# Patient Record
Sex: Female | Born: 1965 | Race: White | Hispanic: No | Marital: Married | Smoking: Never smoker
Health system: Southern US, Community
[De-identification: ages and names within clinical notes are randomized; demographics above are authoritative.]

## PROBLEM LIST (undated history)

## (undated) DIAGNOSIS — T7840XA Allergy, unspecified, initial encounter: Secondary | ICD-10-CM

## (undated) HISTORY — DX: Allergy, unspecified, initial encounter: T78.40XA

---

## 2003-07-14 ENCOUNTER — Other Ambulatory Visit: Admission: RE | Admit: 2003-07-14 | Discharge: 2003-07-14 | Payer: Self-pay | Admitting: Obstetrics and Gynecology

## 2004-10-27 ENCOUNTER — Other Ambulatory Visit: Admission: RE | Admit: 2004-10-27 | Discharge: 2004-10-27 | Payer: Self-pay | Admitting: Obstetrics and Gynecology

## 2006-02-01 ENCOUNTER — Other Ambulatory Visit: Admission: RE | Admit: 2006-02-01 | Discharge: 2006-02-01 | Payer: Self-pay | Admitting: Obstetrics and Gynecology

## 2010-07-09 ENCOUNTER — Encounter: Admission: RE | Admit: 2010-07-09 | Discharge: 2010-07-09 | Payer: Self-pay | Admitting: Obstetrics and Gynecology

## 2011-03-17 ENCOUNTER — Ambulatory Visit (INDEPENDENT_AMBULATORY_CARE_PROVIDER_SITE_OTHER): Payer: BLUE CROSS/BLUE SHIELD | Admitting: Sports Medicine

## 2011-03-17 ENCOUNTER — Encounter: Payer: Self-pay | Admitting: Sports Medicine

## 2011-03-17 DIAGNOSIS — M7711 Lateral epicondylitis, right elbow: Secondary | ICD-10-CM | POA: Insufficient documentation

## 2011-03-17 DIAGNOSIS — M7710 Lateral epicondylitis, unspecified elbow: Secondary | ICD-10-CM

## 2011-03-17 DIAGNOSIS — M25529 Pain in unspecified elbow: Secondary | ICD-10-CM

## 2011-03-17 MED ORDER — KETOPROFEN POWD: Status: DC

## 2011-03-17 NOTE — Assessment & Plan Note (Addendum)
Right elbow/forearm pain secondary to old linear split within muscle belly of forearm extensors which appears healed with extensive calcification as seen on MSK u/s.  Also has small area of calcification at lateral epicondyle as well. Patient fitted with body helix elbow sleeve for adequate compression. Start elbow exercises (wrist extensions & rotations) as noted in patient instructions doing exercises 2-3 times a day. Massage elbow 2-3 times a day with tennis ball or similar object. Rx for ketoprofen gel to apply up to 4 times a day. Given recommendations for tennis racquet tension and grip size. She not initiate any tenderness until can do exercises with 5 pounds and no pain. Followup in 6 weeks for reevaluation.

## 2011-03-17 NOTE — Patient Instructions (Signed)
You have an old tear in your forearm extensors of your right elbow with some calcium. Use massage with tennis ball 2-3 times a day. Start using ketoprofen gel apply to elbows 3-4 times a day. Start doing elbow exercises: - Start with 1 lb: wrist extensions, wrist rotations --> progress to 2 lbs when not having pain with same exercises, then 3 lbs when not having any pain with 2-lbs. - Do exercises 2-3 times a day Plan to return to tennis when able to do 5-lbs without pain.\ For tennis should have string tension no greater than 50. Tennis grip size should be 4 1/8 with overgrip. Follow-up 6-weeks & will repeat ultrasound.

## 2011-03-17 NOTE — Progress Notes (Signed)
  Subjective:    Patient ID: Chelsey Gordon, female    DOB: 26-May-1966, 45 y.o.   MRN: [id number]  HPI 45 year old female to office with complaint of bilateral elbow pain, right greater than left. She eats and writes left-handed, but plays tennis and does all other activities with her right hand. Right elbow pain present for over one year, was ignoring for quite some time and continuing to play tennis but was worsening. Has not played tennis since January due to pain. Pain along the lateral elbow and lateral forearm. Has occasional numbness and tingling down into the hand. Denies any weakness. Has had pain picking up her purse in the past. Has been doing active release therapy with [facility] since February which was initially helpful, but not helping as much any longer. Has tried counterforce braces in the past without much improvement. Is getting ibuprofen as needed.  Also experiencing some intermittent left lateral forearm pain it is not as intense as on the right. Has not tried anything for this side other than the active release therapy. Denies any numbness or tingling. Denies any weakness.  No known drug allergies   Review of Systems    per history of present illness, otherwise review of systems is negative Objective:   Physical Exam GENERAL: ALERT AND ORIENTED X3, NO ACUTE DISTRESS, PLEASANT SKIN: No rashes or lesions RESPIRATIONS: 15 and unlabored MSK: - Right elbow: Full range of motion without pain. No swelling or effusion. Mildly tender palpation over the lateral epicondyles and moderate tenderness over the proximal extensor bundle in the proximal forearm. No medial epicondyle tenderness or olecranon tenderness. Mild discomfort with book test in the lateral forearm, no significant pain or discomfort with resisted wrist extension or resisted wrist flexion.  Normal grip strength. Negative Tinel's over the cubital total. -Left elbow: Full range of motion without pain. No swelling or  effusion. Mild tenderness palpation over the lateral epicondyle. No tenderness over the olecranon or medial epicondyles. Minimal tenderness over extensor bundle in the proximal forearm. No pain with book test or wrist and wrist flexion/extension. Normal grip strength. Negative Tinel's over cubital tunnel.  NEURO: Sensation intact to light touch  VASCULAR: Pulses +2/4 and symmetric radial arteries bilaterally, normal capillary refill   MSK ultrasound:  Right elbow- small hypoechoic area at lateral epicondyle with minimal amount of hyperechoic change consistent with calcification and some surrounding inflammation. No significant increased Doppler flow in this area. Patient is noted to have hyperechoic change within the extensor muscle belly of the proximal forearm visualized in both long and short views measuring approximately 2 cm in longitudinal views. Finding consistent with previous muscle tear which has healed with calcification. Mild increased Doppler flow is appreciated in the proximal forearm in this area.  Left elbow- normal appearing lateral epicondyles and normal-appearing tendons. Small tiny hyperechoic area within the muscle belly of extensor muscle without increased Doppler flow or surrounding edema, this is also consistent with small old muscle tear which has healed with some calcification.       Assessment & Plan:

## 2011-05-03 ENCOUNTER — Ambulatory Visit (INDEPENDENT_AMBULATORY_CARE_PROVIDER_SITE_OTHER): Payer: BLUE CROSS/BLUE SHIELD | Admitting: Sports Medicine

## 2011-05-03 ENCOUNTER — Encounter: Payer: Self-pay | Admitting: Sports Medicine

## 2011-05-03 VITALS — BP 114/74 | HR 62

## 2011-05-03 DIAGNOSIS — R200 Anesthesia of skin: Secondary | ICD-10-CM

## 2011-05-03 DIAGNOSIS — R209 Unspecified disturbances of skin sensation: Secondary | ICD-10-CM

## 2011-05-03 DIAGNOSIS — M25529 Pain in unspecified elbow: Secondary | ICD-10-CM

## 2011-05-03 NOTE — Progress Notes (Signed)
  Subjective:    Patient ID: Chelsey Gordon, female    DOB: August 20, 1966, 45 y.o.   MRN: [id number]  HPI Was seen about 6 weeks ago for right lateral epicondylitis and left forearm tension.    Right lateral epicondylitis: Has been wearing body helix with elbow exercixes, able to do 3 pounds of exercises without pain, has not needed ketoprofen gel recently.  Has not been back to playing tennis yet.    Today is most concerned about bilateral forearm parasthesias.  Right greater than left. 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 Notes left shoulder, neck tension.  No weakness.  Comes and goes sporadically lasting  approx 5-10 minutes at a time with no obvious triggers.    Review of SystemsSee HPI     Objective:   Physical Exam NAD   right elbow, no pain over palpation of lateral epicondyl or forearm extensors.  Able to flex and extend wrist without pai n. Good strength  Neck Exam: Inspection: forward flexed neck position approx 8-10 degress Palpable tenderness: increased muscle tension over left trapezius Negative spurling's Full neck range of motion Grip strength and sensation normal in bilateral hands Strength good C4 to T1 distribution No sensory change to C4 to T1       Assessment & Plan:

## 2011-05-03 NOTE — Assessment & Plan Note (Addendum)
No evidence of cervical spine pathology.  Likely nerve irritation from increased muscle tension, poor posture and more computer use.  Advised on scapular stabilization exercises to promote muscle tension, improve posture to avoid forward flexion of spine.  May also take B6 100 mg daily.  Follow-up in 2-3 months if no improvement.

## 2011-05-03 NOTE — Assessment & Plan Note (Signed)
Marked improvement.  Advised to continue exercises, now may start hitting tennis balls lightly, increase activity slowly as tolerated.

## 2011-09-22 ENCOUNTER — Encounter: Payer: Self-pay | Admitting: Family Medicine

## 2011-09-22 ENCOUNTER — Ambulatory Visit (INDEPENDENT_AMBULATORY_CARE_PROVIDER_SITE_OTHER): Payer: BC Managed Care – PPO | Admitting: Family Medicine

## 2011-09-22 VITALS — BP 94/68 | HR 73

## 2011-09-22 DIAGNOSIS — M25529 Pain in unspecified elbow: Secondary | ICD-10-CM

## 2011-09-22 DIAGNOSIS — M7710 Lateral epicondylitis, unspecified elbow: Secondary | ICD-10-CM

## 2011-09-22 DIAGNOSIS — IMO0002 Reserved for concepts with insufficient information to code with codable children: Secondary | ICD-10-CM

## 2011-09-22 DIAGNOSIS — R209 Unspecified disturbances of skin sensation: Secondary | ICD-10-CM

## 2011-09-22 DIAGNOSIS — M25539 Pain in unspecified wrist: Secondary | ICD-10-CM

## 2011-09-22 DIAGNOSIS — S338XXA Sprain of other parts of lumbar spine and pelvis, initial encounter: Secondary | ICD-10-CM

## 2011-09-22 DIAGNOSIS — R200 Anesthesia of skin: Secondary | ICD-10-CM

## 2011-09-22 MED ORDER — MELOXICAM 15 MG PO TABS: 15.0000 mg | ORAL_TABLET | Freq: Every day | ORAL | Status: DC

## 2011-09-22 NOTE — Progress Notes (Signed)
Subjective:    Patient ID: Chelsey Gordon, female    DOB: October 24, 1966, 45 y.o.   MRN: [id number]  HPI  Left groin pain since November 2011, she felt snap while working out, and developed the  Pain, it got better never disappeared and worsen in the last month.Payton Doughty, 2/10 ache. No numbness or tingling, worse with lounges. Worse with weight bearing activities. She has no noticed   Also right elbow pain on the lateral aspect of her elbow for the last 4-5 month seen for that problem in the past, dull ache 2/10, worse after playing tennis, no numbness or tingling. She is much better compare with before. She discussed previously nitro therapy but she was not a candidate for it.  Patient Active Problem List  Diagnoses  . Elbow pain  . Numbness of upper limb  . Sprain of groin   Current Outpatient Prescriptions on File Prior to Visit  Medication Sig Dispense Refill  . calcium carbonate 200 MG capsule Take 250 mg by mouth daily.        . Ketoprofen POWD Ketoprofen 20% gel compounded - apply to elbows 4-times/day  60 g  3  . magnesium 30 MG tablet Take 30 mg by mouth daily.        . Multiple Vitamin (MULTIVITAMIN) tablet Take 1 tablet by mouth daily.        . Omega 3-6-9 Fatty Acids (OMEGA 3-6-9 COMPLEX PO) Take 1 tablet by mouth daily.        . valACYclovir (VALTREX) 500 MG tablet Take 500 mg by mouth daily.        Marland Kitchen VITAMIN D, CHOLECALCIFEROL, PO Take 1 tablet by mouth daily.         No Known Allergies   Review of Systems  Constitutional: Negative for fever, chills, diaphoresis and fatigue.  Musculoskeletal: Negative for back pain, joint swelling, arthralgias and gait problem.  Neurological: Negative for weakness and numbness.       Objective:   Physical Exam  Constitutional: She is oriented to person, place, and time. She appears well-developed and well-nourished.       BP 94/68  Pulse 73   Pulmonary/Chest: Effort normal.  Musculoskeletal:       Left hip with FROM . TTP in the  inferior left pubis rami. TTP in the adductor magnus tendon.  Mild pain in groin area with fexion-internal rotation   No TTP on the trochanteric bursa. Pain with resisted adduction in the groin area. No limping. Sensation intact distally  No pain with resisted abduction  Right elbow with intact skin. No swelling, no hematoma. Full Range of motion. Tender to palpation over the lateral epicondyle. Wrist extension against resistance reproduce mild  lateral elbow pain. No instability.Sensation intact distally   Neurological: She is alert and oriented to person, place, and time.  Skin: Skin is warm. No rash noted. No erythema. No pallor.  Psychiatric: She has a normal mood and affect.          Assessment & Plan:   1. Sprain of groin  DG Pelvis 1-2 Views, meloxicam (MOBIC) 15 MG tablet  2. Lateral epicondylitis  meloxicam (MOBIC) 15 MG tablet  Decrease abdominal work outs Rest for the following 2 weeks, specially high impact activities Moist heat in the groin area daily Hip stretches. F/U in 2 weeks if no improvement with treatment MSK U/S possible injection  Wrist splint with  Risky activities Ice massage in the elbow for 20 min Lateral epicondylitis  stretches twice a day. F/u in 2-3 weeks.

## 2011-09-23 ENCOUNTER — Ambulatory Visit
Admission: RE | Admit: 2011-09-23 | Discharge: 2011-09-23 | Disposition: A | Discharge status: Home / Self Care | Payer: BC Managed Care – PPO | Source: Ambulatory Visit | Attending: Family Medicine | Admitting: Family Medicine

## 2011-10-13 ENCOUNTER — Ambulatory Visit (INDEPENDENT_AMBULATORY_CARE_PROVIDER_SITE_OTHER): Payer: BC Managed Care – PPO | Admitting: Sports Medicine

## 2011-10-13 VITALS — BP 110/60

## 2011-10-13 DIAGNOSIS — M25529 Pain in unspecified elbow: Secondary | ICD-10-CM

## 2011-10-13 DIAGNOSIS — IMO0002 Reserved for concepts with insufficient information to code with codable children: Secondary | ICD-10-CM

## 2011-10-13 DIAGNOSIS — S338XXA Sprain of other parts of lumbar spine and pelvis, initial encounter: Secondary | ICD-10-CM

## 2011-10-13 DIAGNOSIS — R209 Unspecified disturbances of skin sensation: Secondary | ICD-10-CM

## 2011-10-13 MED ORDER — NITROGLYCERIN 0.2 MG/HR TD PT24: MEDICATED_PATCH | TRANSDERMAL | Status: DC

## 2011-10-14 NOTE — Progress Notes (Signed)
  Subjective:    Patient ID: Chelsey Gordon, female    DOB: 11-19-1966, 45 y.o.   MRN: [id number]  HPI  Ms. Chelsey Gordon is  A pleasant 45 yo female patient coming to F/U on her left groin pain. She has rested from her intensive work out and states that during the first week she noticed a slight improvement but then the pain returned. She was dealing with an URI and noticed than when she cough that will trigger the pain. Her pain is located in the left groin area and in the insertion of the adductor tendons in the pubis. The Dull, 2/10 ache. No numbness or tingling, worse with lounges. Worse with weight bearing activities. No numbness or tingling.  Her elbow pain has resolved.  Patient Active Problem List  Diagnoses  . Elbow pain  . Numbness of upper limb  . Sprain of groin   Current Outpatient Prescriptions on File Prior to Visit  Medication Sig Dispense Refill  . calcium carbonate 200 MG capsule Take 250 mg by mouth daily.        . Ketoprofen POWD Ketoprofen 20% gel compounded - apply to elbows 4-times/day  60 g  3  . magnesium 30 MG tablet Take 30 mg by mouth daily.        . meloxicam (MOBIC) 15 MG tablet Take 1 tablet (15 mg total) by mouth daily.  30 tablet  1  . Multiple Vitamin (MULTIVITAMIN) tablet Take 1 tablet by mouth daily.        . Omega 3-6-9 Fatty Acids (OMEGA 3-6-9 COMPLEX PO) Take 1 tablet by mouth daily.        . valACYclovir (VALTREX) 500 MG tablet Take 500 mg by mouth daily.        Marland Kitchen VITAMIN D, CHOLECALCIFEROL, PO Take 1 tablet by mouth daily.         No Known Allergies    Review of Systems  Constitutional: Negative for fever, chills, diaphoresis and fatigue.  Musculoskeletal: Negative for myalgias, back pain, joint swelling and gait problem.  Neurological: Negative for weakness and numbness.       Objective:   Physical Exam  Constitutional: She appears well-developed and well-nourished.       BP 110/60   Abdominal: Soft. Bowel sounds are normal.       Mild  bulging with valsalva maneuver in left groin area.  Musculoskeletal:       Left hip with FROM . TTP in the inferior left pubis rami. TTP in the adductor magnus tendon. Mild pain in groin area with fexion-internal rotation  No TTP on the trochanteric bursa. Pain with resisted adduction in the groin area. No limping. Sensation intact distally  No pain with resisted abduction    Neurological: She is alert.  Skin: Skin is warm. No rash noted. No erythema. No pallor.  Psychiatric: She has a normal mood and affect.    MSK [place] : Hypoechoic area at the insertion of the adductor tendon in the pubis rami.  There may be a weakness of the posterior inguinal wall.       Assessment & Plan:   1. Sprain of groin  nitroGLYCERIN (NITRODUR - DOSED IN MG/24 HR) 0.2 mg/hr  2. Elbow pain    Nitro patch. Adductor HEP Core strengthening exercise Avoid extreme adductors stretches F/U in 4 weeks

## 2011-10-14 NOTE — Patient Instructions (Signed)
Nitro patch. Adductor HEP Core strengthening exercise Avoid extreme adductors stretches F/U in 4 weeks

## 2011-11-03 ENCOUNTER — Encounter: Payer: Self-pay | Admitting: Family Medicine

## 2011-11-03 ENCOUNTER — Ambulatory Visit (INDEPENDENT_AMBULATORY_CARE_PROVIDER_SITE_OTHER): Payer: BC Managed Care – PPO | Admitting: Family Medicine

## 2011-11-03 VITALS — BP 106/70 | HR 75

## 2011-11-03 DIAGNOSIS — K4120 Bilateral femoral hernia, without obstruction or gangrene, not specified as recurrent: Secondary | ICD-10-CM

## 2011-11-03 NOTE — Progress Notes (Signed)
  Subjective:    Patient ID: Chelsey Gordon, female    DOB: 1966/02/04, 45 y.o.   MRN: [id number]  HPI Chelsey Gordon is A pleasant 45 yo female patient coming to F/U on her left groin pain. She started to do her abdominal workout again and she has not decided name in the left groin/inguinal area, she has not decided a bulging mass in the left groin inguinal hernia.  Her pain is located in the left groin area .Dull, 2/10 ache. No numbness or tingling, worse with lounges. Worse with weight bearing activities. No radiation. Her pubic pain has resolved. She is also complaining of a similar pain on the right groin area but much milder .  Patient Active Problem List  Diagnoses  . Elbow pain  . Numbness of upper limb  . Sprain of groin   Current Outpatient Prescriptions on File Prior to Visit  Medication Sig Dispense Refill  . calcium carbonate 200 MG capsule Take 250 mg by mouth daily.        . magnesium 30 MG tablet Take 30 mg by mouth daily.        . meloxicam (MOBIC) 15 MG tablet Take 1 tablet (15 mg total) by mouth daily.  30 tablet  1  . Multiple Vitamin (MULTIVITAMIN) tablet Take 1 tablet by mouth daily.        . Omega 3-6-9 Fatty Acids (OMEGA 3-6-9 COMPLEX PO) Take 1 tablet by mouth daily.        . valACYclovir (VALTREX) 500 MG tablet Take 500 mg by mouth daily.        Marland Kitchen VITAMIN D, CHOLECALCIFEROL, PO Take 1 tablet by mouth daily.         No Known Allergies      Review of Systems  Constitutional: Negative for fever, chills, diaphoresis and fatigue.  Musculoskeletal: Positive for joint swelling. Negative for back pain, arthralgias and gait problem.  Neurological: Negative for weakness and numbness.       Objective:   Physical Exam  Constitutional: She appears well-developed and well-nourished.       BP 106/70  Pulse 75   Pulmonary/Chest: Effort normal.  Abdominal: Soft.       Left groin area with tenderness to the patient along with the superficial inguinal ring. There is a  bulging mass with Valsalva maneuver in the inguinal area. Right groin area with mild discomfort to palpation along the superficial inguinal ring, no mass felt that with Valsalva maneuver.  Neurological: She is alert.  Skin: Skin is warm. No rash noted. No erythema. No pallor.  Psychiatric: She has a normal mood and affect. Her behavior is normal.    MSK U/S: Of the groin area is significant for a bilateral femoral hernia worse on the left side. There is an obvious mass effect and with Valsalva maneuver pushing out medial to the femoral vein.      Assessment & Plan:     1. Femoral hernia, bilateral     Chelsey Gordon Will choose a general surgeon and with the we'll schedule an appointment with his office to be evaluated regarding her bilateral femoral hernia. She will let us know the name of the surgeon so we can contact his office and spoke with him regarding our ultrasound findings

## 2011-11-23 ENCOUNTER — Encounter (INDEPENDENT_AMBULATORY_CARE_PROVIDER_SITE_OTHER): Payer: Self-pay | Admitting: General Surgery

## 2011-11-23 ENCOUNTER — Ambulatory Visit (INDEPENDENT_AMBULATORY_CARE_PROVIDER_SITE_OTHER): Payer: BC Managed Care – PPO | Admitting: General Surgery

## 2011-11-23 VITALS — BP 120/68 | HR 60 | Temp 97.6°F | Resp 12 | Ht 65.0 in | Wt 126.8 lb

## 2011-11-23 DIAGNOSIS — R1032 Left lower quadrant pain: Secondary | ICD-10-CM | POA: Insufficient documentation

## 2011-11-23 NOTE — Progress Notes (Signed)
Subjective:     Patient ID: Chelsey Gordon, female   DOB: [birth year], 45 y.o.   MRN: [id number]  HPI I was asked to the patient by Dr. fields in regards to possible left femoral hernia. The patient initially developed some left groin pain in November of 2011. This was more medial in her groin musculature and seemed to resolve with rest. A couple months ago she developed a cold and with coughing had pain in a different portion of her left groin. She is not felt a discrete mass popping in and out but continues to have intermittent pain especially with certain activities. She has curtailed her exercise routine. She was seen by Dr. Darrick Penna and ultrasound was done showing some concern for a femoral hernia on both sides. She has not had change in bowel or bladder habits.  Review of Systems  Constitutional: Negative for fever, chills and unexpected weight change.  HENT: Negative for hearing loss, congestion, sore throat, trouble swallowing and voice change.   Eyes: Negative for visual disturbance.  Respiratory: Negative for cough and wheezing.   Cardiovascular: Negative for chest pain, palpitations and leg swelling.  Gastrointestinal: Negative for nausea, vomiting, abdominal pain, diarrhea, constipation, blood in stool, abdominal distention and anal bleeding.  Genitourinary: Negative for hematuria, vaginal bleeding and difficulty urinating.  Musculoskeletal: Negative for arthralgias.       See history of present illness  Skin: Negative for rash and wound.  Neurological: Negative for seizures, syncope and headaches.  Hematological: Negative for adenopathy. Does not bruise/bleed easily.  Psychiatric/Behavioral: Negative for confusion.       Objective:   Physical Exam  Constitutional: She is oriented to person, place, and time. She appears well-developed and well-nourished. No distress.  HENT:  Head: Normocephalic and atraumatic.  Eyes: Conjunctivae are normal. Pupils are equal, round, and reactive to  light.  Neck: Normal range of motion. Neck supple. No tracheal deviation present.  Cardiovascular: Normal rate, regular rhythm and normal heart sounds.   Pulmonary/Chest: Effort normal and breath sounds normal. No respiratory distress. She has no wheezes. She has no rales. She exhibits no tenderness.  Abdominal: Soft. She exhibits no distension and no mass. There is no tenderness. There is no rebound and no guarding.       Right inguinal exam reveals no evidence of inguinal or femoral hernia. Left inguinal exam reveals no evidence of inguinal or femoral hernia.  Musculoskeletal: Normal range of motion.  Neurological: She is alert and oriented to person, place, and time.       Assessment:     Left inguinal pain without demonstrable hernia on either side    Plan:     The patient's symptoms warrant further investigation.I will check a CT scan of the pelvis with contrast. This will evaluate the inguinal and femoral regions on both sides. We will see her back thereafter.

## 2011-11-24 ENCOUNTER — Ambulatory Visit: Payer: BC Managed Care – PPO | Admitting: Family Medicine

## 2011-11-25 ENCOUNTER — Ambulatory Visit
Admission: RE | Admit: 2011-11-25 | Discharge: 2011-11-25 | Disposition: A | Discharge status: Home / Self Care | Payer: BC Managed Care – PPO | Source: Ambulatory Visit | Attending: General Surgery | Admitting: General Surgery

## 2011-11-25 DIAGNOSIS — R1032 Left lower quadrant pain: Secondary | ICD-10-CM

## 2011-11-25 MED ORDER — IOHEXOL 300 MG/ML  SOLN
100.0000 mL | Freq: Once | INTRAMUSCULAR | Status: AC | PRN
  Administered 2011-11-25: 100 mL via INTRAVENOUS

## 2011-12-07 ENCOUNTER — Ambulatory Visit (INDEPENDENT_AMBULATORY_CARE_PROVIDER_SITE_OTHER): Payer: BC Managed Care – PPO | Admitting: General Surgery

## 2011-12-07 ENCOUNTER — Encounter (INDEPENDENT_AMBULATORY_CARE_PROVIDER_SITE_OTHER): Payer: Self-pay | Admitting: General Surgery

## 2011-12-07 VITALS — BP 102/72 | HR 76 | Temp 98.7°F | Resp 18 | Ht 66.0 in | Wt 125.2 lb

## 2011-12-07 DIAGNOSIS — R1032 Left lower quadrant pain: Secondary | ICD-10-CM

## 2011-12-07 NOTE — Patient Instructions (Signed)
Rest your core muscles and take scheduled ibuprofen daily for two weeks

## 2011-12-07 NOTE — Progress Notes (Signed)
Subjective:     Patient ID: Chelsey Gordon, female   DOB: [birth year], 46 y.o.   MRN: [id number]  HPI Patient presents for bilateral inguinal pain following. No hernias were found on her examination so we sent her for CT scan of the abdomen and pelvis. This demonstrated no hernias. The patient wanted to go over her films and we did so in detail. No other significant abnormalities were seen. The pain she is having in both groins and lower and left lower quadrant is improved but not resolved.  Review of Systems     Objective:   Physical Exam  Cardiovascular: Normal rate, normal heart sounds and intact distal pulses.   Pulmonary/Chest: Effort normal and breath sounds normal.  Abdomen is soft and nontender. There is no abdominal wall hernias felt. There are no bilateral inguinal hernias felt. No masses are felt     Assessment:     Bilateral inguinal pain and abdominal wall pain likely due to muscular strain. There is no evidence of hernia.    Plan:     Continue core muscles rest and try 2 week course of ibuprofen scheduled. I feel this should help alleviate this pain as it is most likely musculoskeletal. Return p.r.n.

## 2011-12-14 ENCOUNTER — Encounter (INDEPENDENT_AMBULATORY_CARE_PROVIDER_SITE_OTHER): Payer: BC Managed Care – PPO | Admitting: General Surgery

## 2012-08-15 ENCOUNTER — Ambulatory Visit: Payer: BC Managed Care – PPO | Admitting: Family

## 2012-08-17 ENCOUNTER — Ambulatory Visit (INDEPENDENT_AMBULATORY_CARE_PROVIDER_SITE_OTHER): Payer: BC Managed Care – PPO | Admitting: Family

## 2012-08-17 ENCOUNTER — Encounter: Payer: Self-pay | Admitting: Family

## 2012-08-17 ENCOUNTER — Ambulatory Visit (INDEPENDENT_AMBULATORY_CARE_PROVIDER_SITE_OTHER)
Admission: RE | Admit: 2012-08-17 | Discharge: 2012-08-17 | Disposition: A | Discharge status: Home / Self Care | Payer: BC Managed Care – PPO | Source: Ambulatory Visit | Attending: Family | Admitting: Family

## 2012-08-17 VITALS — BP 98/70 | Temp 98.4°F | Ht 66.0 in | Wt 123.0 lb

## 2012-08-17 DIAGNOSIS — M25539 Pain in unspecified wrist: Secondary | ICD-10-CM

## 2012-08-17 DIAGNOSIS — M25531 Pain in right wrist: Secondary | ICD-10-CM

## 2012-08-17 DIAGNOSIS — Z0000 Encounter for general adult medical examination without abnormal findings: Secondary | ICD-10-CM

## 2012-08-17 LAB — BASIC METABOLIC PANEL
CO2: 27 mEq/L (ref 19–32)
Calcium: 9.3 mg/dL (ref 8.4–10.5)
Chloride: 105 mEq/L (ref 96–112)
Creatinine, Ser: 0.9 mg/dL (ref 0.4–1.2)
GFR: 77.15 mL/min (ref >60.00)
Glucose, Bld: 99 mg/dL (ref 70–99)
Potassium: 4.9 mEq/L (ref 3.5–5.1)
Sodium: 137 mEq/L (ref 135–145)

## 2012-08-17 LAB — CBC WITH DIFFERENTIAL/PLATELET
Eosinophils Absolute: 0 10*3/uL (ref 0.0–0.7)
Hemoglobin: 13.7 g/dL (ref 12.0–15.0)
Lymphocytes Relative: 37.1 % (ref 12.0–46.0)
Monocytes Absolute: 0.5 10*3/uL (ref 0.1–1.0)
Neutro Abs: 2.3 10*3/uL (ref 1.4–7.7)
Neutrophils Relative %: 51 % (ref 43.0–77.0)
WBC: 4.5 10*3/uL (ref 4.5–10.5)

## 2012-08-17 LAB — LIPID PANEL
LDL Cholesterol: 62 mg/dL (ref 0–99)
Triglycerides: 29 mg/dL (ref 0.0–149.0)
VLDL: 5.8 mg/dL (ref 0.0–40.0)

## 2012-08-17 NOTE — Progress Notes (Signed)
Subjective:    Patient ID: Chelsey Gordon, female    DOB: 07-18-1966, 46 y.o.   MRN: [id number]  HPI 46 year old, non smoking white female seen today to establish care. No past surgeries or significant past medical history. C/o intermittent pain in right wrist on extension since June. Has tried ibuprofen with minimal relief. Reports last pelvic exam and mammogram in June 2013.  This is a routine physical examination for this healthy  Female. Reviewed all health maintenance protocols including mammography and reviewed appropriate screening labs. Her immunization history was reviewed as well as her current medications and allergies refills of her chronic medications were given and the plan for yearly health maintenance was discussed all orders and referrals were made as appropriate.   Review of Systems  Constitutional: Negative.  Negative for fever and fatigue.  HENT: Negative.   Eyes: Negative.   Respiratory: Negative.   Cardiovascular: Negative.   Gastrointestinal: Negative.   Genitourinary: Negative.   Musculoskeletal: Positive for arthralgias.       Pain in her right wrist  Skin: Negative.   Neurological: Negative.   Hematological: Negative.   Psychiatric/Behavioral: Negative.    No past medical history on file.  History   Social History  . Marital Status: Married    Spouse Name: N/A    Number of Children: N/A  . Years of Education: N/A   Occupational History  . Not on file.   Social History Main Topics  . Smoking status: Never Smoker   . Smokeless tobacco: Never Used  . Alcohol Use: Yes  . Drug Use: No  . Sexually Active: Not on file   Other Topics Concern  . Not on file   Social History Narrative  . No narrative on file    No past surgical history on file.  Family History  Problem Relation Age of Onset  . Cancer Father     luekemia    No Known Allergies  Current Outpatient Prescriptions on File Prior to Visit  Medication Sig Dispense Refill  .  ALPRAZolam (XANAX) 0.5 MG tablet Take 0.5 mg by mouth as needed.      . Biotin 5 MG CAPS Take by mouth daily.        . calcium carbonate 200 MG capsule Take 250 mg by mouth daily.        Marland Kitchen GLUCOSAMINE-CHONDROITIN PO Take 2 tablets by mouth daily.        . magnesium gluconate (MAGONATE) 500 MG tablet Take 500 mg by mouth daily.        . Multiple Vitamin (MULTIVITAMIN) tablet Take 1 tablet by mouth daily.        . Omega 3-6-9 Fatty Acids (OMEGA 3-6-9 COMPLEX PO) Take 1 tablet by mouth daily.        Marland Kitchen pyridOXINE (VITAMIN B-6) 100 MG tablet Take 100 mg by mouth daily.        . valACYclovir (VALTREX) 500 MG tablet Take 500 mg by mouth daily.        Marland Kitchen VITAMIN D, CHOLECALCIFEROL, PO Take 1 tablet by mouth daily.          BP 98/70  Temp 98.4 F (36.9 C) (Oral)  Ht 5\' 6"  (1.676 m)  Wt 123 lb (55.792 kg)  BMI 19.85 kg/m2chart    Objective:   Physical Exam  Constitutional: She is oriented to person, place, and time. She appears well-developed and well-nourished.  HENT:  Head: Normocephalic and atraumatic.  Right Ear: External ear  normal.  Left Ear: External ear normal.  Nose: Nose normal.  Mouth/Throat: Oropharynx is clear and moist.  Eyes: Conjunctivae normal and EOM are normal. Pupils are equal, round, and reactive to light.  Neck: Normal range of motion. Neck supple.  Cardiovascular: Normal rate and regular rhythm.  Exam reveals friction rub.   No murmur heard. Pulmonary/Chest: Effort normal and breath sounds normal.  Abdominal: Soft. Bowel sounds are normal.  Musculoskeletal: Normal range of motion. She exhibits no edema and no tenderness.       Right wrist: full ROM with no pain elicited. Grip strengths normal.  Neurological: She is alert and oriented to person, place, and time. She has normal reflexes. She displays normal reflexes. No cranial nerve deficit. She exhibits normal muscle tone. Coordination normal.  Skin: Skin is warm and dry.  Psychiatric: She has a normal mood and  affect. Her behavior is normal. Judgment and thought content normal.          Assessment & Plan:  Assessment: CPX, Right wrist pain  Plan; CBC, BMP, Lipid panel, TSH, referred to radiology for Xray of wrist wrist. Follow up as needed or if symptoms persist of become worst. Health diet and exercise 30 min a day. Self breast exams monthly.

## 2012-08-17 NOTE — Patient Instructions (Addendum)

## 2015-03-02 ENCOUNTER — Encounter: Payer: Self-pay | Admitting: Family Medicine

## 2015-03-02 ENCOUNTER — Ambulatory Visit (INDEPENDENT_AMBULATORY_CARE_PROVIDER_SITE_OTHER): Payer: Managed Care, Other (non HMO) | Admitting: Family Medicine

## 2015-03-02 ENCOUNTER — Encounter (INDEPENDENT_AMBULATORY_CARE_PROVIDER_SITE_OTHER): Payer: Self-pay

## 2015-03-02 VITALS — BP 116/71 | HR 61 | Ht 66.0 in | Wt 123.0 lb

## 2015-03-02 DIAGNOSIS — S86112A Strain of other muscle(s) and tendon(s) of posterior muscle group at lower leg level, left leg, initial encounter: Secondary | ICD-10-CM

## 2015-03-02 DIAGNOSIS — S86812A Strain of other muscle(s) and tendon(s) at lower leg level, left leg, initial encounter: Secondary | ICD-10-CM | POA: Diagnosis not present

## 2015-03-02 NOTE — Patient Instructions (Signed)
You have a calf strain/partial tear (tennis leg) Compression sleeve to help with swelling and pain when you're up and walking around. Icing for 15 minutes at a time 3-4 times a day for the first 3 days then ice or heat depending on whichever feels better to you. Heel lifts either in temporary orthotics or on their own to prevent further strain. Cam walker for 7-10 days (with or without the heel lifts). Crutches as needed Tylenol and/or aleve for pain. Start ankle range of motion exercises (up/down and alphabet exercises). 3 sets of 10 twice a day. Cycling with low resistance or swimming for cross training in meantime. Follow up in 2 weeks at the latest.

## 2015-03-04 DIAGNOSIS — S86119A Strain of other muscle(s) and tendon(s) of posterior muscle group at lower leg level, unspecified leg, initial encounter: Secondary | ICD-10-CM | POA: Insufficient documentation

## 2015-03-04 NOTE — Assessment & Plan Note (Signed)
Compression sleeve, icing then consider heat after 3 days.  Heel lifts in cam walker for 7-10 days.  Tylenol, nsaids if needed.  Start ROM exercises twice a day.  Cross training if tolerated.  F/u in 2 weeks at the latest.

## 2015-03-04 NOTE — Progress Notes (Signed)
PCP: CAMPBELL, PADONDA BOYD, FNP  Subjective:   HPI: Patient is a 49 y.o. female here for left calf injury.  Patient reports last night when playing tennis she felt a pop in medial left calf. No prior injuries. No swelling or bruising. Pain level down to 1-2/10. Not taking anything currently for this.  No past medical history on file.  Current Outpatient Prescriptions on File Prior to Visit  Medication Sig Dispense Refill  . ALPRAZolam (XANAX) 0.5 MG tablet Take 0.5 mg by mouth as needed.    . Biotin 5 MG CAPS Take by mouth daily.      . calcium carbonate 200 MG capsule Take 250 mg by mouth daily.      Marland Kitchen GLUCOSAMINE-CHONDROITIN PO Take 2 tablets by mouth daily.      . magnesium gluconate (MAGONATE) 500 MG tablet Take 500 mg by mouth daily.      . Multiple Vitamin (MULTIVITAMIN) tablet Take 1 tablet by mouth daily.      . Omega 3-6-9 Fatty Acids (OMEGA 3-6-9 COMPLEX PO) Take 1 tablet by mouth daily.      Marland Kitchen pyridOXINE (VITAMIN B-6) 100 MG tablet Take 100 mg by mouth daily.      . valACYclovir (VALTREX) 500 MG tablet Take 500 mg by mouth daily.      Marland Kitchen VITAMIN D, CHOLECALCIFEROL, PO Take 1 tablet by mouth daily.       No current facility-administered medications on file prior to visit.    No past surgical history on file.  No Known Allergies  History   Social History  . Marital Status: Married    Spouse Name: N/A  . Number of Children: N/A  . Years of Education: N/A   Occupational History  . Not on file.   Social History Main Topics  . Smoking status: Never Smoker   . Smokeless tobacco: Never Used  . Alcohol Use: Yes  . Drug Use: No  . Sexual Activity: Not on file   Other Topics Concern  . Not on file   Social History Narrative    Family History  Problem Relation Age of Onset  . Cancer Father     luekemia    BP 116/71 mmHg  Pulse 61  Ht 5\' 6"  (1.676 m)  Wt 123 lb (55.792 kg)  BMI 19.86 kg/m2  Review of Systems: See HPI above.    Objective:   Physical Exam:  Gen: NAD  Left lower leg: No gross deformity, swelling, bruising, defect. TTP medial gastroc at musculotendinous junction.  No achilles, other tenderness. FROM ankle with pain on full plantarflexion and dorsiflexion. Unable to do calf raise. NVI distally.    Assessment & Plan:  1. Left calf strain - Compression sleeve, icing then consider heat after 3 days.  Heel lifts in cam walker for 7-10 days.  Tylenol, nsaids if needed.  Start ROM exercises twice a day.  Cross training if tolerated.  F/u in 2 weeks at the latest.

## 2015-03-16 ENCOUNTER — Encounter: Payer: Self-pay | Admitting: Sports Medicine

## 2015-03-16 ENCOUNTER — Ambulatory Visit (INDEPENDENT_AMBULATORY_CARE_PROVIDER_SITE_OTHER): Payer: Managed Care, Other (non HMO) | Admitting: Sports Medicine

## 2015-03-16 VITALS — BP 109/66 | HR 57 | Ht 66.0 in | Wt 123.0 lb

## 2015-03-16 DIAGNOSIS — S86812A Strain of other muscle(s) and tendon(s) at lower leg level, left leg, initial encounter: Secondary | ICD-10-CM

## 2015-03-16 DIAGNOSIS — S86112A Strain of other muscle(s) and tendon(s) of posterior muscle group at lower leg level, left leg, initial encounter: Secondary | ICD-10-CM

## 2015-03-16 NOTE — Progress Notes (Signed)
   Subjective:    Patient ID: Chelsey Gordon, female    DOB: 1966/11/02, 49 y.o.   MRN: [id number]  HPI   Patient comes in today for follow-up on a left calf strain. She was treated by Dr. Barbaraann Barthel on March 28. Initial treatment was with a Cam Walker with heel raises and crutches as needed for pain. She quickly transitioned out of the boot and off of her crutches. She is now walking pain-free. She still has some discomfort with certain exercises in the gym but overall her symptoms are much improved over the past couple of weeks. She did get some mild swelling initially but denies ecchymosis. She has had problems with her calf and the past. She is wearing a compression sleeve with activity as well.     Review of Systems     Objective:   Physical Exam Well-developed, well-nourished. No acute distress. Awake alert and oriented 3.  Left calf: There is still some slight tenderness to palpation at the medial head of the gastrocnemius. No palpable defect. No soft tissue swelling. No ecchymosis. No pain with Achilles or calf stretching. Patient does have some pain with performing a single calf raise on the left. Achilles tendon is intact. Neurovascular intact distally. Walking without a limp.       Assessment & Plan:  Improving left calf pain secondary to calf strain  I've given the patient a copy of [place] heel drop exercises but I have instructed her not to start these until she is able to perform a single calf raise on a step without pain. She will continue with her compression and I have also encouraged her to continue with her heel lifts until she is completely asymptomatic. She may continue to increase her activity in the gym using pain as her guide but I recommended against any running or sprinting for at least the next 3 weeks. She will follow-up for ongoing or recalcitrant issues.

## 2015-12-28 ENCOUNTER — Encounter: Payer: Self-pay | Admitting: Sports Medicine

## 2015-12-28 ENCOUNTER — Ambulatory Visit (INDEPENDENT_AMBULATORY_CARE_PROVIDER_SITE_OTHER): Payer: 59 | Admitting: Sports Medicine

## 2015-12-28 VITALS — BP 108/76 | Ht 66.0 in | Wt 123.0 lb

## 2015-12-28 DIAGNOSIS — M25511 Pain in right shoulder: Secondary | ICD-10-CM | POA: Diagnosis not present

## 2015-12-28 DIAGNOSIS — M7711 Lateral epicondylitis, right elbow: Secondary | ICD-10-CM | POA: Diagnosis not present

## 2015-12-28 NOTE — Progress Notes (Signed)
Subjective:    Patient ID: Chelsey Gordon, female    DOB: 09-Jul-1966, 50 y.o.   MRN: [id number]  Chelsey Gordon is a 50 y.o. female presenting on 12/28/2015 for Right Tennis Elbow and Right Should Pain  HPI   RIGHT SHOULDER PAIN: - Reports symptoms started around Thanksgiving (Nov 2016), first noticed some Right Lateral to Posterior Shoulder pain and discomfort while doing some fly exercises with dumbbells, felt like it was "tweaked" had played tennis the day before, denies an actual "acute injury" at this time but did feel more pain than usual. She continued her exercise and realized next day that there was more pain in her Right shoulder than usual, and decided to rest. Stated it felt like a "pulling in shoulder". Treated this with rest, ice, eventually heat, ibuprofen, took 4-5 weeks off all tennis / weights. Then felt about 95% back to normal, was lifting some logs over Christmas and felt it "re-tweak". 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 Also had a Right forearm muscle strain at that time. Active tennis player previously 2-3x weekly, now over past 1 year or so, she has only played 1x weekly for about 3-4x monthly, has indoor tennis contract. She does not play tennis during summers, and did not play in December 2016 with R-shoulder injury. 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 Describes tenderness intermittently directly over Lateral epicondyle. Tried occasional ibuprofen with relief, tried wearing counterforce brace but difficulty with this and playing tennis - Asking about other options for future, injections, PT - Denies any swelling, redness, numbness, tingling, weakness  Social History   Social History  . Marital Status: Married    Spouse Name: N/A  . Number of Children: N/A  . Years of Education: N/A   Occupational History  . Not on file.   Social History Main Topics  . Smoking status: Never Smoker   . Smokeless tobacco: Never Used  . Alcohol Use: Yes  . Drug Use: No  . Sexual Activity: Not on file   Other Topics Concern  . Not on file   Social History Narrative    Review of Systems Per HPI unless specifically indicated above     Objective:    BP 108/76 mmHg  Ht 5\' 6"  (1.676 m)  Wt 123 lb (55.792 kg)  BMI 19.86 kg/m2  Wt Readings from Last 3 Encounters:  12/28/15 123 lb (55.792 kg)  03/16/15 123 lb (55.792 kg)  03/02/15 123 lb (55.792 kg)    Physical Exam  Constitutional: She appears well-developed and well-nourished. No distress.  Musculoskeletal:  Right Shoulder: Inspection: Normal appearance, symmetrical to Left. Palpation: Localized one fingertip area of tenderness only minimal to palpation posterior/lateral shoulder. No edema or effusion. ROM: Full AROM forward flex above head, internal rotation behind back. Symmetrical.  Pain reproduced only in forward flexion, abduction and posterior rotation. Special Testing: Rotator cuff strength testing intact supraspinatus, full/empty can and int/ext rotation. O'Briens labral testing with mild pain but no significant weakness. Hawkins impingement testing negative for pain. Strength: 5/5 intact rotator cuff, triceps, biceps, grip Neurovascular: distally intact  Right Elbow Inspection: Normal appearance,  symmetrical to Left. Palpation: Mild +TTP directly over Latearl epicondyle and local muscle tendons. ROM: Full active ROM elbow and wrist. Special Testing / Strength: +localized lateral epicondyle pain with elbow ext with wrist extension, middle finger extension, strength with these 5/5 wrist ext/flex, grip Neurovascular: distally intact  Skin: Skin is warm and dry. She is not diaphoretic. No erythema.  Nursing note and vitals reviewed.   Bedside [facility] Ultrasound today 12/28/15 Right Elbow - Lateral Epicondyle Several images evaluated of Right lateral epicondyle with view of epicondyle and radius. Small area of calcification observed within common extensor tendon, no significant edema or tear. No other pathology.     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 - Inadequate conservative therapy with occasional NSAIDs. Not wearing counterforce brace - No prior injections or procedures. No imaging.  Plan: 1. Ordered PT with dry needling for Right lateral epicondylitis (with either Almira Coaster, or Vivi Ferns) 2. Wear counterforce brace at work on computer 3. Activity modification 4. NSAIDs PRN 5. Discussed future considerations with unlikely to get relief from steroid injection, can consider referral to hand ortho in future to consider debridement given chronicity of problem 6. RTC 1 month      Right shoulder pain    Consistent with chronic recurrent mild R-posterior/lateral shoulder pain. Currently very functional, does not limit work, exercise, or tennis, but has modified some overhead activities. No significant deficits on exam, some minor pain with labral testing. No rotator cuff weakness. - Diff dx: Most likely concerned for possible Labral small tear or irritation. Less  likely to be rotator cuff tendinopathy, no evidence for tear.  - No imaging on chart  Plan: 1. Ordered PT for Right shoulder pain (and lateral epicondylitis) with Almira Coaster or Vivi Ferns 2. Activity modification to avoid pain 3. Conservative therapies as discussed 4. RTC 1 month, re-evaluate if not improved with PT may consider advanced imaging with possible MRI arthrogram in future to evaluate labrum          Meds ordered this encounter  Medications  . diazepam (VALIUM) 5 MG tablet    Sig:     Refill:  0  . valACYclovir (VALTREX) 1000 MG tablet    Sig: TK 1 T PO QD    Refill:  12      Follow up plan: Return in about 4 weeks (around 01/25/2016) for Right shoulder (?labrum), lateral epicondylitis.  Nobie Putnam, [place], PGY-3  Patient seen and evaluated with the resident. I agree with the plan of care. We will see how she responds to some dry kneeling for her chronic right elbow lateral epicondylitis. If her shoulder pain persists a follow-up that I would consider an ultrasound-guided intra-articular cortisone injection.

## 2015-12-28 NOTE — Assessment & Plan Note (Signed)
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 - Inadequate conservative therapy with occasional NSAIDs. Not wearing counterforce brace - No prior injections or procedures. No imaging.  Plan: 1. Ordered PT with dry needling for Right lateral epicondylitis (with either Almira Coaster, or Vivi Ferns) 2. Wear counterforce brace at work on computer 3. Activity modification 4. NSAIDs PRN 5. Discussed future considerations with unlikely to get relief from steroid injection, can consider referral to hand ortho in future to consider debridement given chronicity of problem 6. RTC 1 month

## 2015-12-28 NOTE — Assessment & Plan Note (Signed)
Consistent with chronic recurrent mild R-posterior/lateral shoulder pain. Currently very functional, does not limit work, exercise, or tennis, but has modified some overhead activities. No significant deficits on exam, some minor pain with labral testing. No rotator cuff weakness. - Diff dx: Most likely concerned for possible Labral small tear or irritation. Less likely to be rotator cuff tendinopathy, no evidence for tear.  - No imaging on chart  Plan: 1. Ordered PT for Right shoulder pain (and lateral epicondylitis) with Almira Coaster or Vivi Ferns 2. Activity modification to avoid pain 3. Conservative therapies as discussed 4. RTC 1 month, re-evaluate if not improved with PT may consider advanced imaging with possible MRI arthrogram in future to evaluate labrum

## 2015-12-28 NOTE — Patient Instructions (Signed)
Follow-up with PT for both shoulder and right elbow. Use counterforce brace Follow-up 1 month.

## 2018-08-14 ENCOUNTER — Other Ambulatory Visit: Payer: Self-pay | Admitting: Obstetrics and Gynecology

## 2018-08-14 DIAGNOSIS — R928 Other abnormal and inconclusive findings on diagnostic imaging of breast: Secondary | ICD-10-CM

## 2018-08-15 ENCOUNTER — Ambulatory Visit
Admission: RE | Admit: 2018-08-15 | Discharge: 2018-08-15 | Disposition: A | Discharge status: Home / Self Care | Payer: 59 | Source: Ambulatory Visit | Attending: Obstetrics and Gynecology | Admitting: Obstetrics and Gynecology

## 2018-08-15 DIAGNOSIS — R928 Other abnormal and inconclusive findings on diagnostic imaging of breast: Secondary | ICD-10-CM

## 2019-03-08 IMAGING — MG DIGITAL DIAGNOSTIC UNILATERAL RIGHT MAMMOGRAM WITH TOMO AND CAD
4 series · 4 of 12 positions shown · non-contrast
Comparison: Previous exam(s).

CLINICAL DATA: Possible mass in the anterior aspect of the
upper-outer right breast on a recent screening mammogram.

EXAM:
DIGITAL DIAGNOSTIC RIGHT MAMMOGRAM WITH TOMO
ULTRASOUND RIGHT BREAST

[R CC synth-2D]
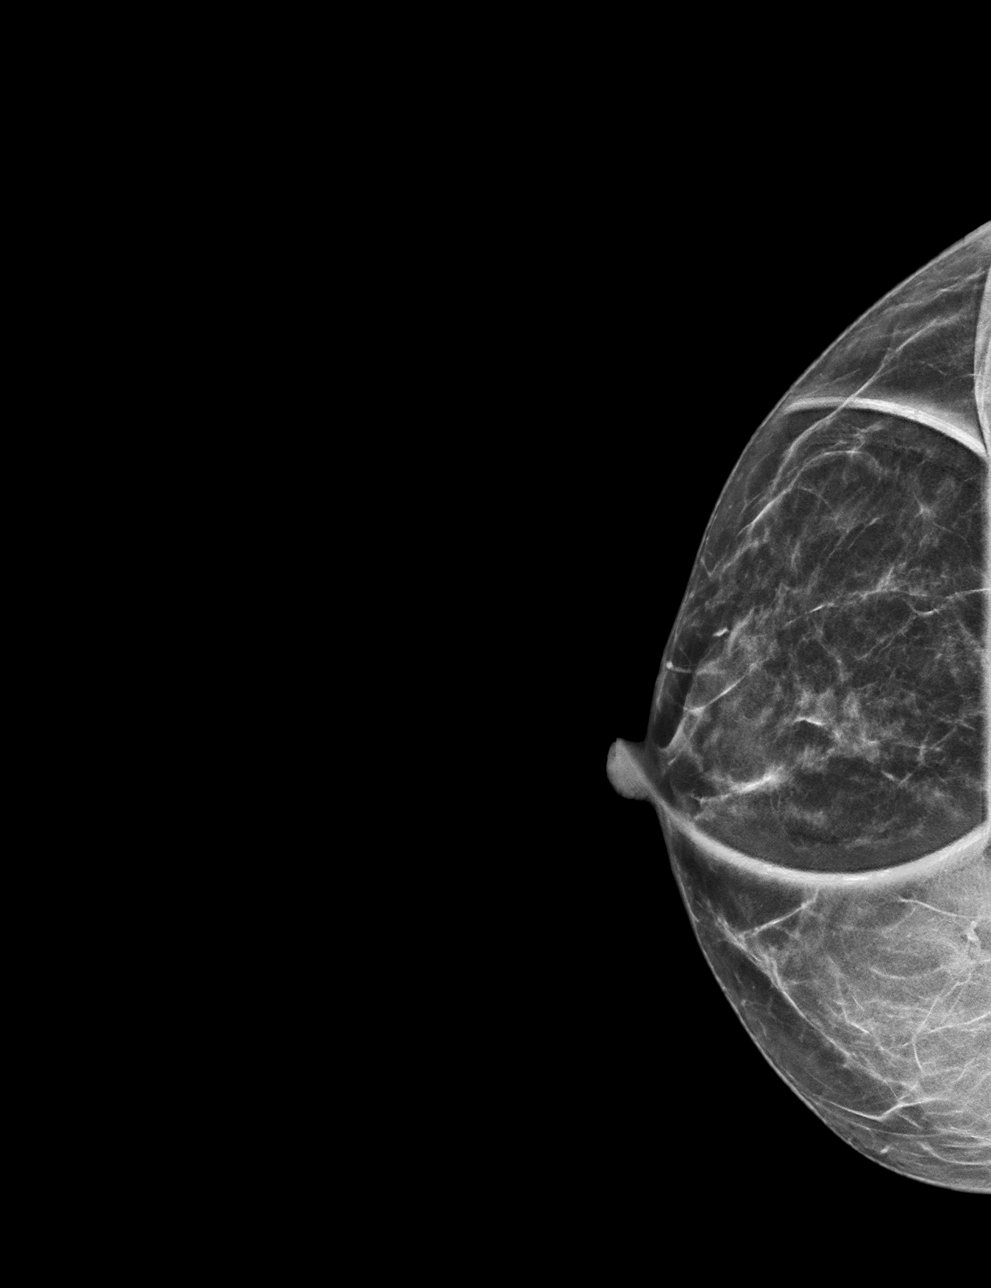

[R MLO synth-2D]
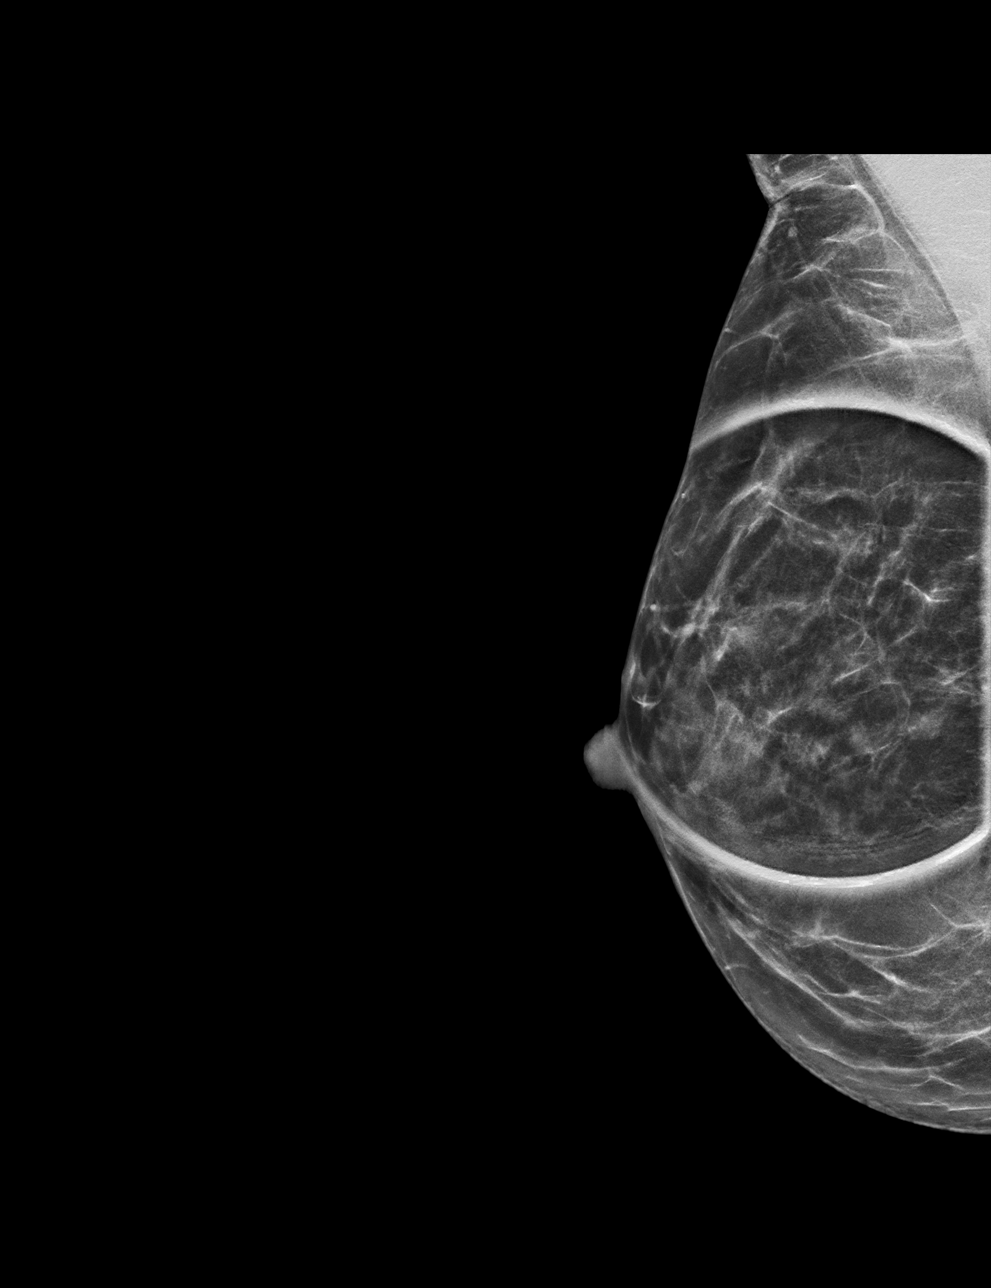

[R MLO tomo · tomo slice 26/51.0]
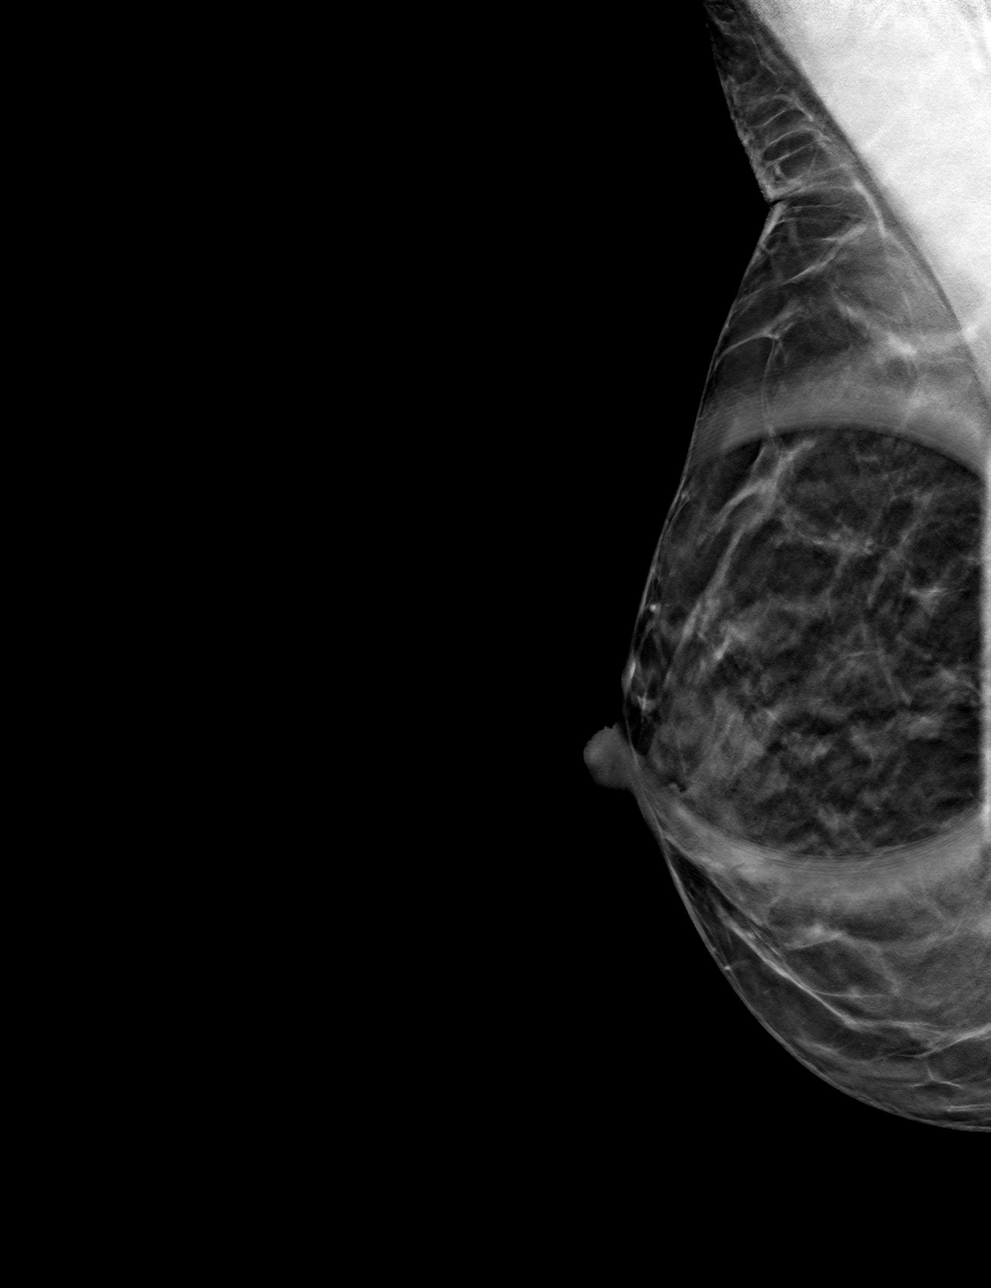

[R CC tomo · tomo slice 24/47.0]
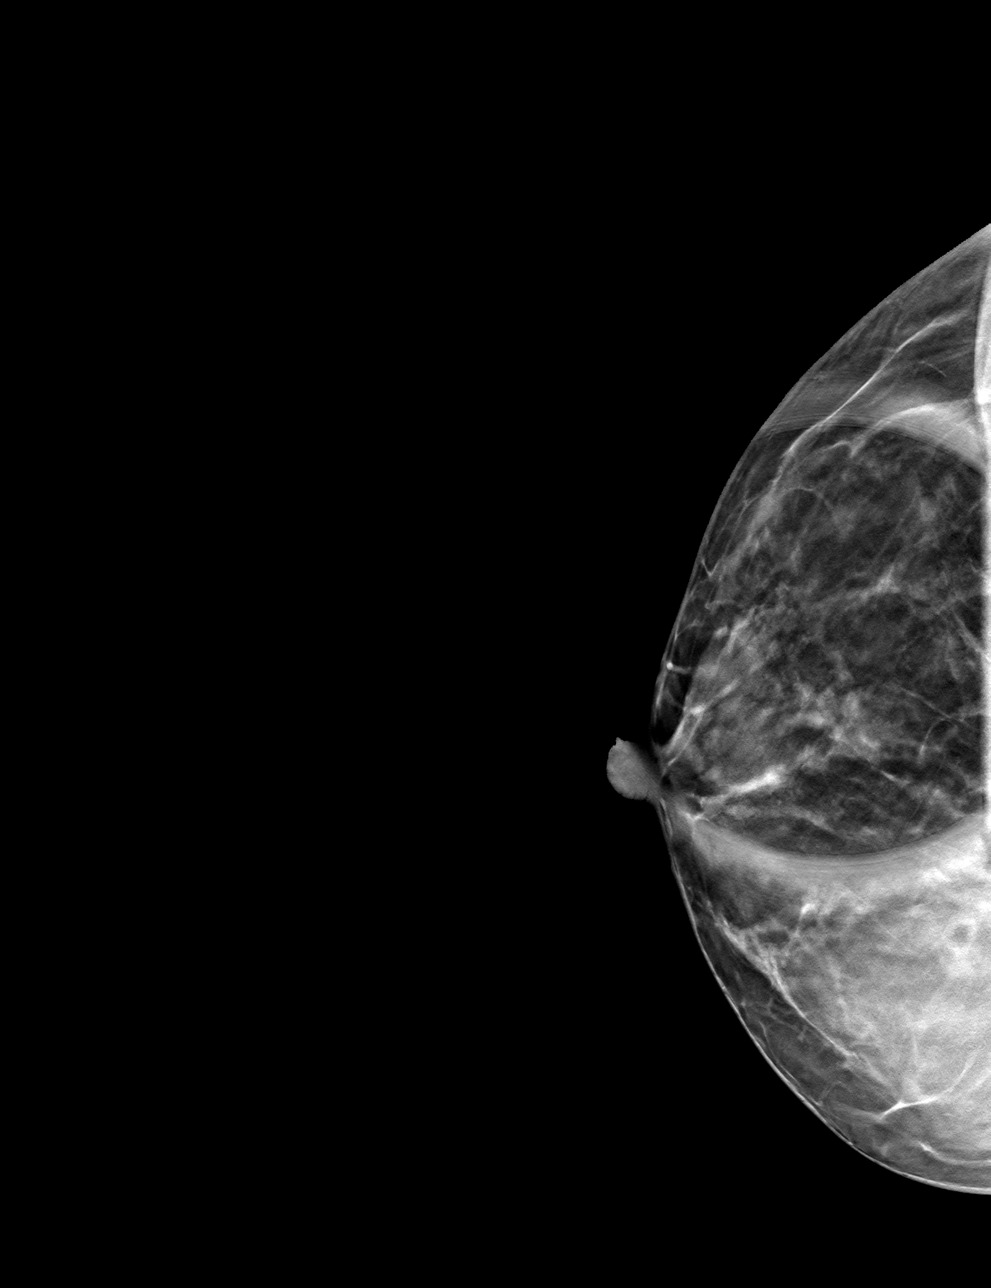

[4 of 12 positions shown; findings below may reference images not displayed]

ACR Breast Density Category c: The breast tissue is heterogeneously
dense, which may obscure small masses.
FINDINGS: 3D tomographic and 2D generated spot compression views of the right
breast demonstrate normal appearing breast tissue at the location of
the recently suspected mass.

Targeted ultrasound is performed, showing a 1.3 x 1.1 x 0.4 cm
cluster of cysts in the 12 o'clock position of the right breast, 1
cm from the nipple. This corresponds to the initially suspected
mammographic mass.
IMPRESSION: 1.3 cm benign cluster of cysts in the 12 o'clock position of the
right breast. No evidence of malignancy.

RECOMMENDATION:
Bilateral screening mammogram in 1 year.

I have discussed the findings and recommendations with the patient.
Results were also provided in writing at the conclusion of the
visit. If applicable, a reminder letter will be sent to the patient
regarding the next appointment.

BI-RADS CATEGORY  2: Benign.

## 2019-03-08 IMAGING — US ULTRASOUND RIGHT BREAST LIMITED
1 series · 8 of 8 positions shown · non-contrast
Comparison: Previous exam(s).

CLINICAL DATA: Possible mass in the anterior aspect of the
upper-outer right breast on a recent screening mammogram.

EXAM:
DIGITAL DIAGNOSTIC RIGHT MAMMOGRAM WITH TOMO
ULTRASOUND RIGHT BREAST

[Series 1: ultrasound right breast limited · 0.06mm/px · 8 of 8 slices shown]
[im 1/8]
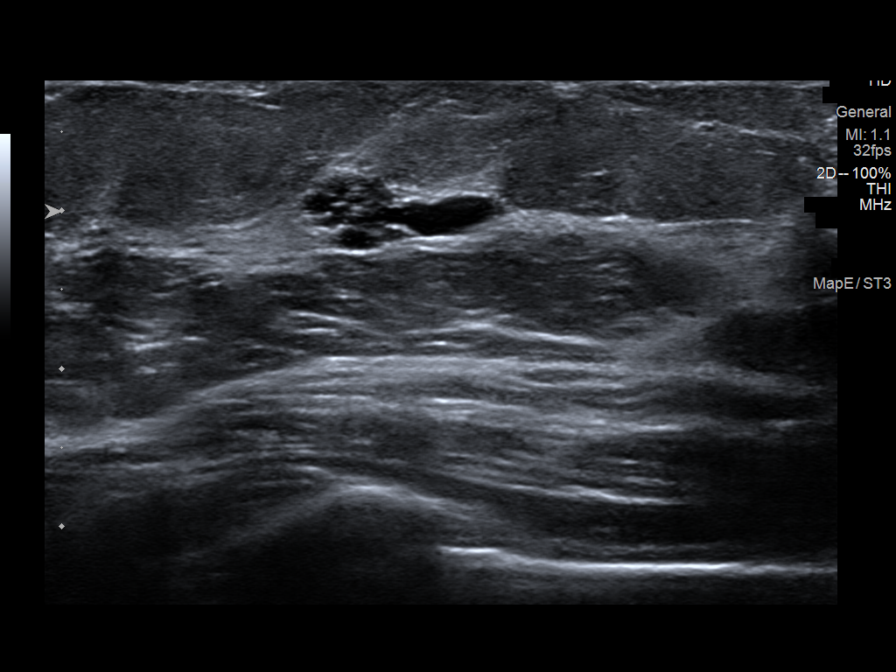
[im 2/8]
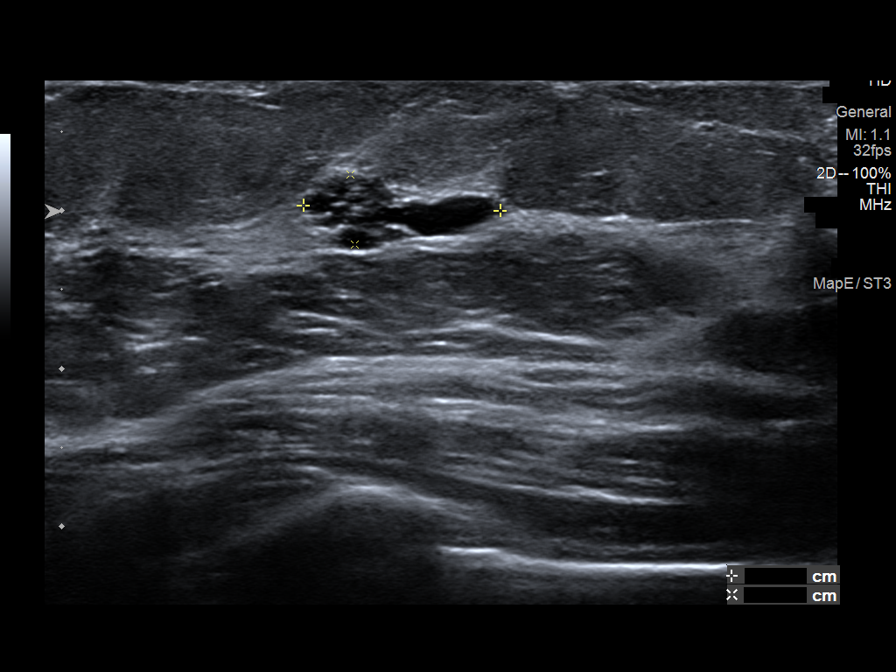
[im 3/8]
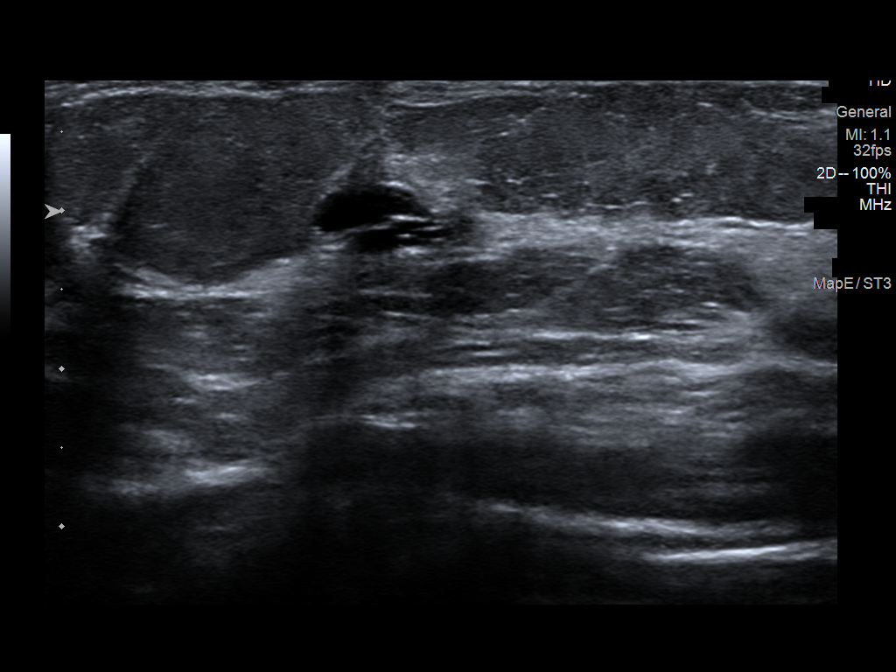
[im 4/8]
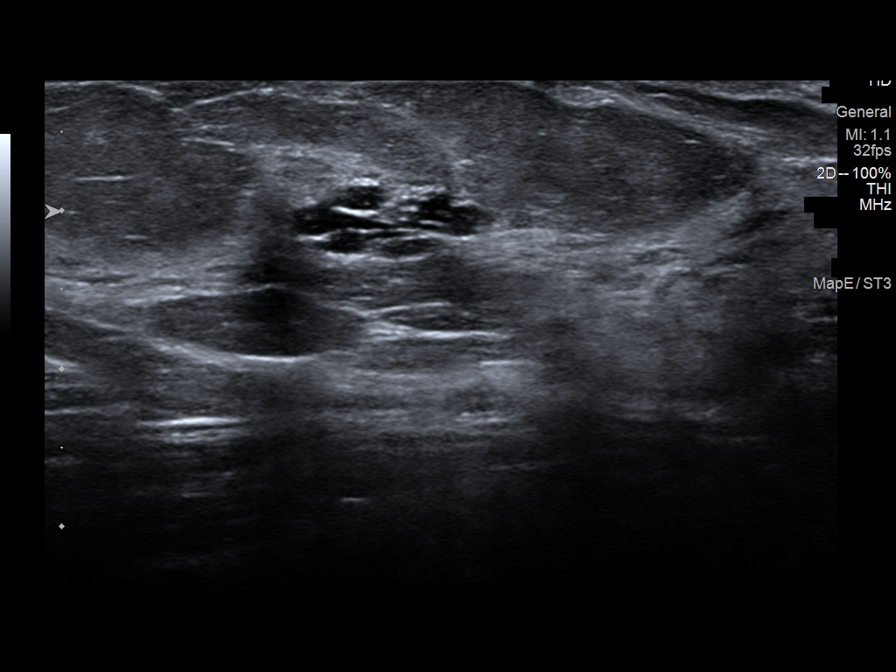
[im 5/8]
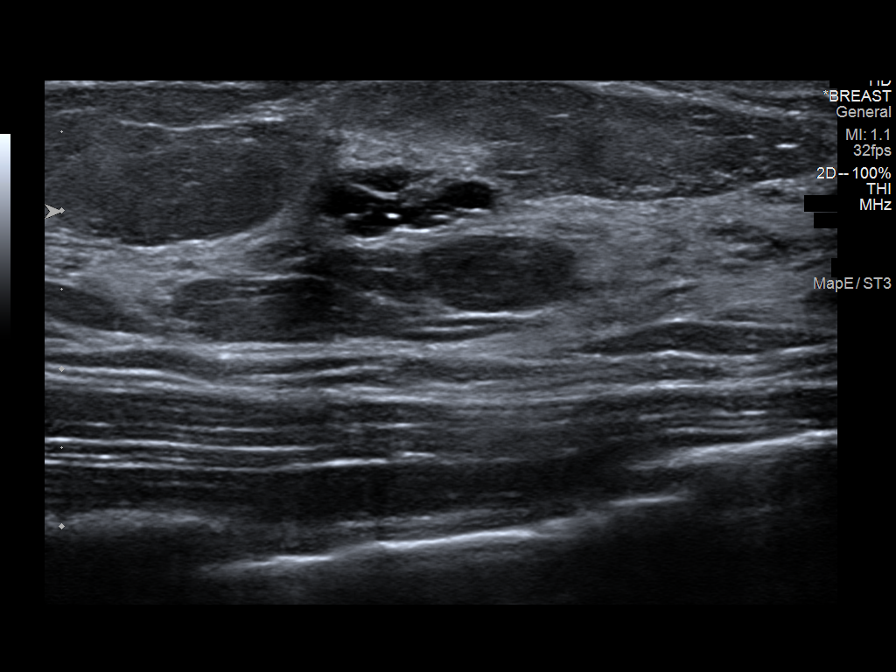
[im 6/8]
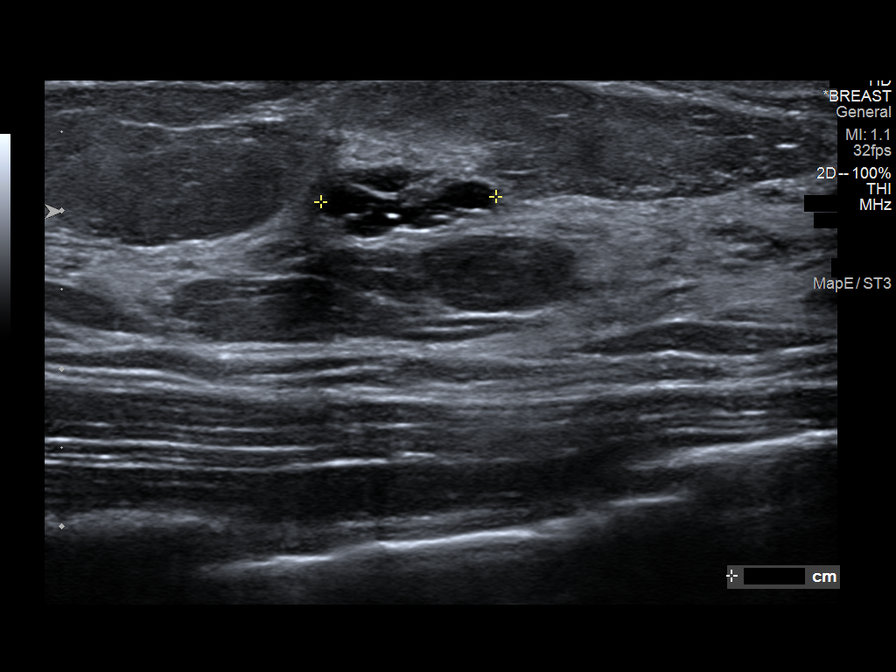
[im 7/8]
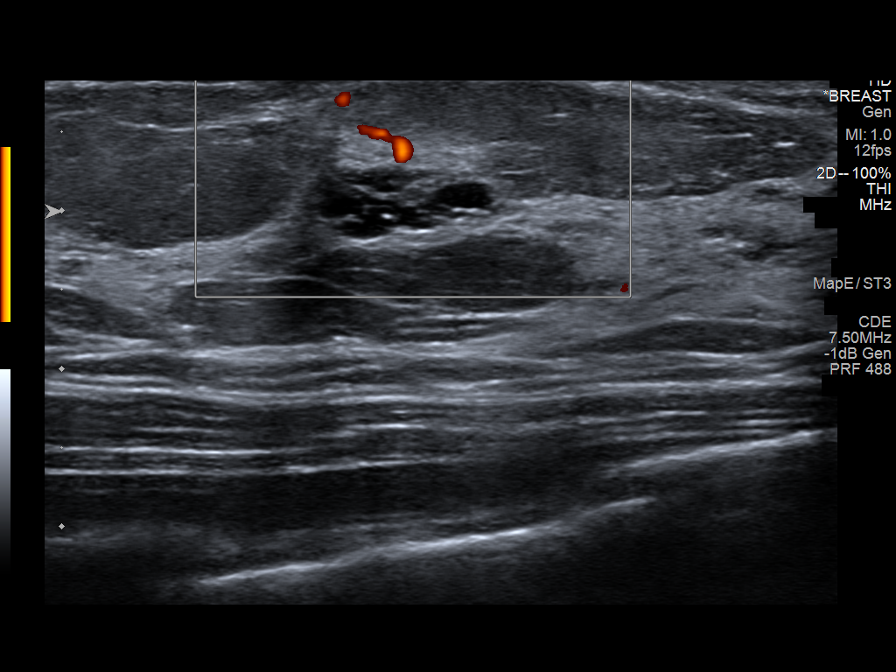
[im 8/8]
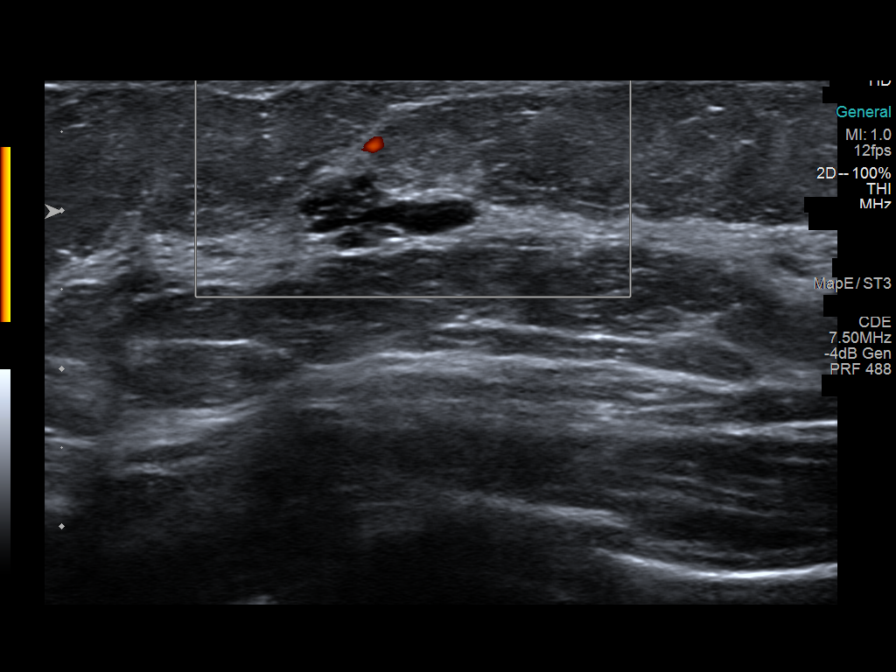

[8 of 8 positions shown; findings below may reference images not displayed]

ACR Breast Density Category c: The breast tissue is heterogeneously
dense, which may obscure small masses.
FINDINGS: 3D tomographic and 2D generated spot compression views of the right
breast demonstrate normal appearing breast tissue at the location of
the recently suspected mass.

Targeted ultrasound is performed, showing a 1.3 x 1.1 x 0.4 cm
cluster of cysts in the 12 o'clock position of the right breast, 1
cm from the nipple. This corresponds to the initially suspected
mammographic mass.
IMPRESSION: 1.3 cm benign cluster of cysts in the 12 o'clock position of the
right breast. No evidence of malignancy.

RECOMMENDATION:
Bilateral screening mammogram in 1 year.

I have discussed the findings and recommendations with the patient.
Results were also provided in writing at the conclusion of the
visit. If applicable, a reminder letter will be sent to the patient
regarding the next appointment.

BI-RADS CATEGORY  2: Benign.

## 2021-08-19 ENCOUNTER — Ambulatory Visit: Payer: 59 | Admitting: Orthopedic Surgery

## 2021-08-20 ENCOUNTER — Ambulatory Visit (INDEPENDENT_AMBULATORY_CARE_PROVIDER_SITE_OTHER): Payer: 59 | Admitting: Orthopedic Surgery

## 2021-08-20 ENCOUNTER — Other Ambulatory Visit: Payer: Self-pay

## 2021-08-20 ENCOUNTER — Encounter: Payer: Self-pay | Admitting: Orthopedic Surgery

## 2021-08-20 ENCOUNTER — Ambulatory Visit: Payer: Self-pay

## 2021-08-20 VITALS — BP 119/80 | HR 73 | Ht 66.0 in | Wt 128.8 lb

## 2021-08-20 DIAGNOSIS — M79645 Pain in left finger(s): Secondary | ICD-10-CM

## 2021-08-20 NOTE — Progress Notes (Signed)
Office Visit Note   Patient: Chelsey Gordon           Date of Birth: Jan 21, 1966           MRN: [id number] Visit Date: 08/20/2021              Requested by: No referring provider defined for this encounter. PCP: No primary care provider on file.   Assessment & Plan: Visit Diagnoses:  1. 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  It was noticed incidentally and has not changed in size or appearance since then.  We discussed treatment options including continued observation and monitoring versus surgical excision.  The mass is asymptomatic so she wants to continue to observe it for now.  She can follow-up as needed.  Follow-Up Instructions: No follow-ups on file.   Orders:  Orders Placed This Encounter  Procedures   XR Finger Thumb Left   No orders of the defined types were placed in this encounter.     Procedures: No procedures performed   Clinical Data: No additional findings.   Subjective: Chief Complaint  Patient presents with   Left Hand - New Patient (Initial Visit)    This is a 55 year old right-hand-dominant female who presents with a volar radial left thumb mass.  The mass i was first noticed incidentally about 2-1/2 months ago.  But is asymptomatic.  It is not painful.  She denies any numbness or paresthesias in this aspect she denies any numbness or paresthesias of the radial aspect of the thumb.  She denies any locking clicking or catching with flexion of the thumb.  She denies any other lumps or bumps in the hand.  The mass has not changed in size or appearance since it was first noticed.   Review of Systems  Constitutional: Negative.   Respiratory: Negative.    Cardiovascular: Negative.   Skin: Negative.   Neurological: Negative.     Objective: Vital Signs: BP 119/80 (BP Location: Left Arm, Patient Position: Sitting, Cuff Size:  Normal)   Pulse 73   Ht '5\' 6"'$  (1.676 m)   Wt 128 lb 12.8 oz (58.4 kg)   SpO2 97%   BMI 20.79 kg/m   Physical Exam Constitutional:      Appearance: Normal appearance.  Cardiovascular:     Rate and Rhythm: Normal rate.     Pulses: Normal pulses.  Pulmonary:     Effort: Pulmonary effort is normal.  Skin:    General: Skin is warm and dry.     Capillary Refill: Capillary refill takes 2 to 3 seconds.  Neurological:     General: No focal deficit present.     Mental Status: She is alert.    Left Hand Exam   Tenderness  The patient is experiencing no tenderness.   Range of Motion  The patient has normal left wrist ROM.  Muscle Strength  The patient has normal left wrist strength.  Other  Erythema: absent Sensation: normal Pulse: present  Comments:  Approx 1x1cm mass at volar radial aspect of thumb over the MP joint.  Mass is firm and well circumscribed.  It doesn't seem to move w/ flexion of the thumb.  She has no palpable locking or catching with flexion of the thumb.      Specialty Comments:  No specialty comments available.  Imaging: 3V  of the left thumb taken today are reviewed and interpreted by me.  There are minimal degenerative changes at the [facility] joint.  There is no degenerative change at the thumb MP joint.    PMFS History: Patient Active Problem List   Diagnosis Date Noted   Right shoulder pain 12/28/2015   Gastrocnemius muscle strain 03/04/2015   Left inguinal pain 11/23/2011   Sprain of groin 09/22/2011   Numbness of upper limb 05/03/2011   Lateral epicondylitis of right elbow 03/17/2011   History reviewed. No pertinent past medical history.  Family History  Problem Relation Age of Onset   Cancer Father        luekemia    History reviewed. No pertinent surgical history. Social History   Occupational History   Not on file  Tobacco Use   Smoking status: Never   Smokeless tobacco: Never  Substance and Sexual Activity   Alcohol use: Yes    Drug use: No   Sexual activity: Not on file

## 2021-12-17 ENCOUNTER — Encounter: Payer: Self-pay | Admitting: Gastroenterology

## 2022-01-18 ENCOUNTER — Ambulatory Visit (AMBULATORY_SURGERY_CENTER): Payer: Managed Care, Other (non HMO)

## 2022-01-18 ENCOUNTER — Other Ambulatory Visit: Payer: Self-pay

## 2022-01-18 VITALS — Ht 66.0 in | Wt 127.0 lb

## 2022-01-18 DIAGNOSIS — Z1211 Encounter for screening for malignant neoplasm of colon: Secondary | ICD-10-CM

## 2022-01-18 MED ORDER — NA SULFATE-K SULFATE-MG SULF 17.5-3.13-1.6 GM/177ML PO SOLN: 1.0000 | Freq: Once | ORAL | 0 refills | Status: AC

## 2022-01-18 NOTE — Progress Notes (Signed)
Patient's pre-visit was done today over the phone with the patient   Name,DOB and address verified.    Patient denies any allergies to Eggs and Soy.   Patient denies any problems with anesthesia/sedation.--pt states has never had surgery or procedures  Patient denies taking diet pills or blood thinners.   Denies atrial flutter or atrial fib  Denies chronic constipation  No home Oxygen.   Packet of Prep instructions sent by My Chart or mail to patient including a copy of a consent form if by mail-pt is aware.   Patient understands to call us back with any questions or concerns.   Patient is aware of our care-partner policy and GQQPY-19 safety protocol.    In putting in prep, it was flagged for a allergen noted in pt's record that pt has no knowledge of it being an allergy.   Called pt back and discussed allergy hx and flag that I got related to putting Suprep order in for Alitriq.  Pt said that she had not every said or been told she was allergic to Msud  or Alitraq.  She avoids glutens by choice d/t bloating and that she feels better when she doesn't eat it.  Does not have Celiac or any other related disease.  She felt that the allergies were put in her chart by mistake.  Offered to change to Miralax prep, but pt declined.  Also discussed this Sundra Aland.

## 2022-01-28 ENCOUNTER — Encounter: Payer: Self-pay | Admitting: Gastroenterology

## 2022-02-01 ENCOUNTER — Ambulatory Visit (AMBULATORY_SURGERY_CENTER): Payer: Managed Care, Other (non HMO) | Admitting: Gastroenterology

## 2022-02-01 ENCOUNTER — Other Ambulatory Visit: Payer: Self-pay

## 2022-02-01 ENCOUNTER — Encounter: Payer: Self-pay | Admitting: Gastroenterology

## 2022-02-01 VITALS — BP 130/68 | HR 57 | Temp 98.5°F | Resp 11 | Ht 66.0 in | Wt 127.0 lb

## 2022-02-01 DIAGNOSIS — D123 Benign neoplasm of transverse colon: Secondary | ICD-10-CM

## 2022-02-01 DIAGNOSIS — Z1211 Encounter for screening for malignant neoplasm of colon: Secondary | ICD-10-CM | POA: Diagnosis present

## 2022-02-01 MED ORDER — SODIUM CHLORIDE 0.9 % IV SOLN: 500.0000 mL | Freq: Once | INTRAVENOUS | Status: DC

## 2022-02-01 NOTE — Progress Notes (Signed)
HPI: This is a woman at routine risk for CRC  ROS: complete GI ROS as described in HPI, all other review negative.  Constitutional:  No unintentional weight loss   Past Medical History:  Diagnosis Date   Allergy     History reviewed. No pertinent surgical history.  Current Outpatient Medications  Medication Sig Dispense Refill   estradiol (ESTRACE) 1 MG tablet estradiol 1 mg tablet  TAKE 1 TABLET BY MOUTH EVERY DAY     fluticasone (FLONASE) 50 MCG/ACT nasal spray Flonase Allergy Relief     Loratadine (CLARITIN PO) Claritin     Turmeric (QC TUMERIC COMPLEX PO) Take by mouth.     VITAMIN D, CHOLECALCIFEROL, PO Take 1 tablet by mouth daily.       ALPRAZolam (XANAX) 0.5 MG tablet Take 0.5 mg by mouth as needed. (Patient not taking: Reported on 01/18/2022)     levonorgestrel (MIRENA, 52 MG,) 20 MCG/DAY IUD Mirena 21 mcg/24 hours (8 yrs) 52 mg intrauterine device  Take by intrauterine route.     magnesium gluconate (MAGONATE) 500 MG tablet Take 500 mg by mouth daily.   (Patient not taking: Reported on 01/18/2022)     Omega 3-6-9 Fatty Acids (OMEGA 3-6-9 COMPLEX PO) Take 1 tablet by mouth daily.   (Patient not taking: Reported on 01/18/2022)     valACYclovir (VALTREX) 500 MG tablet Take 500 mg by mouth daily.   (Patient not taking: Reported on 01/18/2022)     Current Facility-Administered Medications  Medication Dose Route Frequency Provider Last Rate Last Admin   0.9 %  sodium chloride infusion  500 mL Intravenous Once Milus Banister, MD        Allergies as of 02/01/2022 - Review Complete 02/01/2022  Allergen Reaction Noted   Gluten meal  08/20/2021   Msud aid [alitraq]  08/20/2021    Family History  Problem Relation Age of Onset   Cancer Father        luekemia   Colon cancer Neg Hx    Colon polyps Neg Hx    Esophageal cancer Neg Hx    Stomach cancer Neg Hx    Rectal cancer Neg Hx     Social History   Socioeconomic History   Marital status: Married    Spouse name: Not  on file   Number of children: Not on file   Years of education: Not on file   Highest education level: Not on file  Occupational History   Not on file  Tobacco Use   Smoking status: Never   Smokeless tobacco: Never  Vaping Use   Vaping Use: Never used  Substance and Sexual Activity   Alcohol use: Yes   Drug use: No   Sexual activity: Not on file  Other Topics Concern   Not on file  Social History Narrative   Not on file   Social Determinants of Health   Financial Resource Strain: Not on file  Food Insecurity: Not on file  Transportation Needs: Not on file  Physical Activity: Not on file  Stress: Not on file  Social Connections: Not on file  Intimate Partner Violence: Not on file     Physical Exam: BP 117/75    Pulse 60    Temp 98.5 F (36.9 C)    Ht 5\' 6"  (1.676 m)    Wt 127 lb (57.6 kg)    SpO2 97%    BMI 20.50 kg/m  Constitutional: generally well-appearing Psychiatric: alert and oriented x3 Lungs: CTA bilaterally  Heart: no MCR  Assessment and plan: 56 y.o. female with routine risk for CRC  Screening colonoscopy today  Care is appropriate for the ambulatory setting.  Owens Loffler, MD [facility] 02/01/2022, 7:44 AM

## 2022-02-01 NOTE — Progress Notes (Signed)
VS- Chelsey Gordon  Pt's states no medical or surgical changes since previsit or office visit.  

## 2022-02-01 NOTE — Progress Notes (Signed)
Sedate, gd SR, tolerated procedure well, VSS, report to RN 

## 2022-02-01 NOTE — Patient Instructions (Signed)
Resume previous diet and medications. °Awaiting pathology results. Repeat Colonoscopy date to be determined based on pathology results. ° °YOU HAD AN ENDOSCOPIC PROCEDURE TODAY AT THE [facility]:   Refer to the procedure report that was given to you for any specific questions about what was found during the examination.  If the procedure report does not answer your questions, please call your gastroenterologist to clarify.  If you requested that your care partner not be given the details of your procedure findings, then the procedure report has been included in a sealed envelope for you to review at your convenience later. ° °YOU SHOULD EXPECT: Some feelings of bloating in the abdomen. Passage of more gas than usual.  Walking can help get rid of the air that was put into your GI tract during the procedure and reduce the bloating. If you had a lower endoscopy (such as a colonoscopy or flexible sigmoidoscopy) you may notice spotting of blood in your stool or on the toilet paper. If you underwent a bowel prep for your procedure, you may not have a normal bowel movement for a few days. ° °Please Note:  You might notice some irritation and congestion in your nose or some drainage.  This is from the oxygen used during your procedure.  There is no need for concern and it should clear up in a day or so. ° °SYMPTOMS TO REPORT IMMEDIATELY: ° °Following lower endoscopy (colonoscopy or flexible sigmoidoscopy): ° Excessive amounts of blood in the stool ° Significant tenderness or worsening of abdominal pains ° Swelling of the abdomen that is new, acute ° Fever of 100°F or higher ° °For urgent or emergent issues, a gastroenterologist can be reached at any hour by calling [phone]. °Do not use MyChart messaging for urgent concerns.  ° ° °DIET:  We do recommend a small meal at first, but then you may proceed to your regular diet.  Drink plenty of fluids but you should avoid alcoholic beverages for 24  hours. ° °ACTIVITY:  You should plan to take it easy for the rest of today and you should NOT DRIVE or use heavy machinery until tomorrow (because of the sedation medicines used during the test).   ° °FOLLOW UP: °Our staff will call the number listed on your records 48-72 hours following your procedure to check on you and address any questions or concerns that you may have regarding the information given to you following your procedure. If we do not reach you, we will leave a message.  We will attempt to reach you two times.  During this call, we will ask if you have developed any symptoms of COVID 19. If you develop any symptoms (ie: fever, flu-like symptoms, shortness of breath, cough etc.) before then, please call [phone].  If you test positive for Covid 19 in the 2 weeks post procedure, please call and report this information to us.   ° °If any biopsies were taken you will be contacted by phone or by letter within the next 1-3 weeks.  Please call us at [phone] if you have not heard about the biopsies in 3 weeks.  ° ° °SIGNATURES/CONFIDENTIALITY: °You and/or your care partner have signed paperwork which will be entered into your electronic medical record.  These signatures attest to the fact that that the information above on your After Visit Summary has been reviewed and is understood.  Full responsibility of the confidentiality of this discharge information lies with you and/or your care-partner.  °

## 2022-02-01 NOTE — Op Note (Signed)
[facility] Patient Name: Chelsey Gordon Procedure Date: 02/01/2022 8:00 AM MRN: [id number] Endoscopist: Milus Banister , MD Age: 56 Referring MD:  Date of Birth: [birth year] Gender: Female Account #: [id number] Procedure:                Colonoscopy Indications:              Screening for colorectal malignant neoplasm Medicines:                Monitored Anesthesia Care Procedure:                Pre-Anesthesia Assessment:                           - Prior to the procedure, a History and Physical                            was performed, and patient medications and                            allergies were reviewed. The patient's tolerance of                            previous anesthesia was also reviewed. The risks                            and benefits of the procedure and the sedation                            options and risks were discussed with the patient.                            All questions were answered, and informed consent                            was obtained. Prior Anticoagulants: The patient has                            taken no previous anticoagulant or antiplatelet                            agents. ASA Grade Assessment: II - A patient with                            mild systemic disease. After reviewing the risks                            and benefits, the patient was deemed in                            satisfactory condition to undergo the procedure.                           After obtaining informed consent, the colonoscope  was passed under direct vision. Throughout the                            procedure, the patient's blood pressure, pulse, and                            oxygen saturations were monitored continuously. The                            CF [id number] #[id number] was introduced through the anus                            and advanced to the the cecum, identified by                            appendiceal orifice  and ileocecal valve. The                            colonoscopy was performed without difficulty. The                            patient tolerated the procedure well. The quality                            of the bowel preparation was good. The ileocecal                            valve, appendiceal orifice, and rectum were                            photographed. Scope In: 8:02:36 AM Scope Out: 8:16:24 AM Scope Withdrawal Time: 0 hours 10 minutes 24 seconds  Total Procedure Duration: 0 hours 13 minutes 48 seconds  Findings:                 A 5 mm polyp was found in the transverse colon. The                            polyp was sessile. The polyp was removed with a                            cold snare. Resection and retrieval were complete.                           The exam was otherwise without abnormality on                            direct and retroflexion views. Complications:            No immediate complications. Estimated blood loss:                            None. Estimated Blood Loss:     Estimated blood loss: none. Impression:               -  One 5 mm polyp in the transverse colon, removed                            with a cold snare. Resected and retrieved.                           - The examination was otherwise normal on direct                            and retroflexion views. Recommendation:           - Patient has a contact number available for                            emergencies. The signs and symptoms of potential                            delayed complications were discussed with the                            patient. Return to normal activities tomorrow.                            Written discharge instructions were provided to the                            patient.                           - Resume previous diet.                           - Continue present medications.                           - Await pathology results. Milus Banister, MD 02/01/2022  8:20:04 AM This report has been signed electronically.

## 2022-02-01 NOTE — Progress Notes (Signed)
Called to room to assist during endoscopic procedure.  Patient ID and intended procedure confirmed with present staff. Received instructions for my participation in the procedure from the performing physician.  

## 2022-02-03 ENCOUNTER — Telehealth: Payer: Self-pay

## 2022-02-03 NOTE — Telephone Encounter (Signed)
?  Follow up Call- ? ?Call back number 02/01/2022  ?Post procedure Call Back phone  # [phone]  ?Permission to leave phone message Yes  ?Some recent data might be hidden  ?  ? ?Patient questions: ? ?Do you have a fever, pain , or abdominal swelling? No. ?Pain Score  0 * ? ?Have you tolerated food without any problems? Yes.   ? ?Have you been able to return to your normal activities? Yes.   ? ?Do you have any questions about your discharge instructions: ?Diet   No. ?Medications  No. ?Follow up visit  No. ? ?Do you have questions or concerns about your Care? No. ? ?Actions: ?* If pain score is 4 or above: ?No action needed, pain <4. ? ? ?

## 2022-02-07 ENCOUNTER — Encounter: Payer: Self-pay | Admitting: Gastroenterology

## 2023-09-12 DIAGNOSIS — N841 Polyp of cervix uteri: Secondary | ICD-10-CM | POA: Diagnosis not present

## 2023-09-12 DIAGNOSIS — Z6821 Body mass index (BMI) 21.0-21.9, adult: Secondary | ICD-10-CM | POA: Diagnosis not present

## 2023-09-12 DIAGNOSIS — Z1231 Encounter for screening mammogram for malignant neoplasm of breast: Secondary | ICD-10-CM | POA: Diagnosis not present

## 2023-09-12 DIAGNOSIS — Z01419 Encounter for gynecological examination (general) (routine) without abnormal findings: Secondary | ICD-10-CM | POA: Diagnosis not present

## 2023-09-12 DIAGNOSIS — Z124 Encounter for screening for malignant neoplasm of cervix: Secondary | ICD-10-CM | POA: Diagnosis not present

## 2023-09-14 DIAGNOSIS — D2261 Melanocytic nevi of right upper limb, including shoulder: Secondary | ICD-10-CM | POA: Diagnosis not present

## 2023-09-14 DIAGNOSIS — L905 Scar conditions and fibrosis of skin: Secondary | ICD-10-CM | POA: Diagnosis not present

## 2023-09-14 DIAGNOSIS — L570 Actinic keratosis: Secondary | ICD-10-CM | POA: Diagnosis not present

## 2023-09-14 DIAGNOSIS — D2262 Melanocytic nevi of left upper limb, including shoulder: Secondary | ICD-10-CM | POA: Diagnosis not present

## 2023-09-14 DIAGNOSIS — D225 Melanocytic nevi of trunk: Secondary | ICD-10-CM | POA: Diagnosis not present

## 2023-09-14 DIAGNOSIS — D2271 Melanocytic nevi of right lower limb, including hip: Secondary | ICD-10-CM | POA: Diagnosis not present

## 2023-09-14 DIAGNOSIS — L821 Other seborrheic keratosis: Secondary | ICD-10-CM | POA: Diagnosis not present

## 2023-09-14 DIAGNOSIS — D2272 Melanocytic nevi of left lower limb, including hip: Secondary | ICD-10-CM | POA: Diagnosis not present

## 2023-09-14 DIAGNOSIS — Z85828 Personal history of other malignant neoplasm of skin: Secondary | ICD-10-CM | POA: Diagnosis not present

## 2023-09-14 DIAGNOSIS — L819 Disorder of pigmentation, unspecified: Secondary | ICD-10-CM | POA: Diagnosis not present

## 2024-09-16 DIAGNOSIS — D485 Neoplasm of uncertain behavior of skin: Secondary | ICD-10-CM | POA: Diagnosis not present

## 2024-09-16 DIAGNOSIS — L720 Epidermal cyst: Secondary | ICD-10-CM | POA: Diagnosis not present

## 2024-09-16 DIAGNOSIS — D225 Melanocytic nevi of trunk: Secondary | ICD-10-CM | POA: Diagnosis not present

## 2024-09-16 DIAGNOSIS — D2271 Melanocytic nevi of right lower limb, including hip: Secondary | ICD-10-CM | POA: Diagnosis not present

## 2024-09-16 DIAGNOSIS — D2262 Melanocytic nevi of left upper limb, including shoulder: Secondary | ICD-10-CM | POA: Diagnosis not present

## 2024-09-16 DIAGNOSIS — D2272 Melanocytic nevi of left lower limb, including hip: Secondary | ICD-10-CM | POA: Diagnosis not present

## 2024-09-16 DIAGNOSIS — C44519 Basal cell carcinoma of skin of other part of trunk: Secondary | ICD-10-CM | POA: Diagnosis not present

## 2024-09-16 DIAGNOSIS — L821 Other seborrheic keratosis: Secondary | ICD-10-CM | POA: Diagnosis not present

## 2024-09-16 DIAGNOSIS — Z85828 Personal history of other malignant neoplasm of skin: Secondary | ICD-10-CM | POA: Diagnosis not present

## 2024-09-16 DIAGNOSIS — D2261 Melanocytic nevi of right upper limb, including shoulder: Secondary | ICD-10-CM | POA: Diagnosis not present

## 2024-09-23 DIAGNOSIS — Z1231 Encounter for screening mammogram for malignant neoplasm of breast: Secondary | ICD-10-CM | POA: Diagnosis not present

## 2024-09-23 DIAGNOSIS — Z13820 Encounter for screening for osteoporosis: Secondary | ICD-10-CM | POA: Diagnosis not present

## 2024-09-24 DIAGNOSIS — Z6821 Body mass index (BMI) 21.0-21.9, adult: Secondary | ICD-10-CM | POA: Diagnosis not present

## 2024-09-24 DIAGNOSIS — Z01419 Encounter for gynecological examination (general) (routine) without abnormal findings: Secondary | ICD-10-CM | POA: Diagnosis not present

## 2024-09-24 DIAGNOSIS — N951 Menopausal and female climacteric states: Secondary | ICD-10-CM | POA: Diagnosis not present

## 2024-09-24 DIAGNOSIS — Z760 Encounter for issue of repeat prescription: Secondary | ICD-10-CM | POA: Diagnosis not present

## 2024-10-10 DIAGNOSIS — L988 Other specified disorders of the skin and subcutaneous tissue: Secondary | ICD-10-CM | POA: Diagnosis not present

## 2024-10-10 DIAGNOSIS — C44519 Basal cell carcinoma of skin of other part of trunk: Secondary | ICD-10-CM | POA: Diagnosis not present
